# Patient Record
Sex: Female | Born: 1995 | Race: White | Hispanic: No | Marital: Married | Smoking: Never smoker
Health system: Southern US, Community
[De-identification: ages and names within clinical notes are randomized; demographics above are authoritative.]

## PROBLEM LIST (undated history)

## (undated) DIAGNOSIS — R109 Unspecified abdominal pain: Secondary | ICD-10-CM

## (undated) DIAGNOSIS — E7800 Pure hypercholesterolemia, unspecified: Secondary | ICD-10-CM

## (undated) DIAGNOSIS — E282 Polycystic ovarian syndrome: Secondary | ICD-10-CM

## (undated) DIAGNOSIS — E162 Hypoglycemia, unspecified: Secondary | ICD-10-CM

## (undated) DIAGNOSIS — O139 Gestational [pregnancy-induced] hypertension without significant proteinuria, unspecified trimester: Secondary | ICD-10-CM

## (undated) DIAGNOSIS — K589 Irritable bowel syndrome without diarrhea: Secondary | ICD-10-CM

## (undated) DIAGNOSIS — R197 Diarrhea, unspecified: Secondary | ICD-10-CM

## (undated) DIAGNOSIS — D682 Hereditary deficiency of other clotting factors: Secondary | ICD-10-CM

## (undated) HISTORY — DX: Gestational (pregnancy-induced) hypertension without significant proteinuria, unspecified trimester: O13.9

## (undated) HISTORY — PX: MANDIBLE SURGERY: SHX707

## (undated) HISTORY — PX: TYMPANOSTOMY TUBE PLACEMENT: SHX32

## (undated) HISTORY — DX: Unspecified abdominal pain: R10.9

## (undated) HISTORY — DX: Diarrhea, unspecified: R19.7

---
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
## 2016-05-17 ENCOUNTER — Ambulatory Visit (INDEPENDENT_AMBULATORY_CARE_PROVIDER_SITE_OTHER): Payer: Self-pay | Admitting: Sports Medicine

## 2016-10-24 ENCOUNTER — Observation Stay (HOSPITAL_COMMUNITY): Payer: 59

## 2016-10-24 ENCOUNTER — Observation Stay (HOSPITAL_COMMUNITY)
Admission: EM | Admit: 2016-10-24 | Discharge: 2016-10-25 | Disposition: A | Discharge status: Home / Self Care | Payer: 59 | Attending: Oncology | Admitting: Oncology

## 2016-10-24 ENCOUNTER — Encounter (HOSPITAL_COMMUNITY): Payer: Self-pay

## 2016-10-24 ENCOUNTER — Emergency Department (HOSPITAL_COMMUNITY): Payer: 59

## 2016-10-24 DIAGNOSIS — Z888 Allergy status to other drugs, medicaments and biological substances status: Secondary | ICD-10-CM | POA: Diagnosis not present

## 2016-10-24 DIAGNOSIS — F419 Anxiety disorder, unspecified: Secondary | ICD-10-CM | POA: Insufficient documentation

## 2016-10-24 DIAGNOSIS — E785 Hyperlipidemia, unspecified: Secondary | ICD-10-CM | POA: Insufficient documentation

## 2016-10-24 DIAGNOSIS — K580 Irritable bowel syndrome with diarrhea: Secondary | ICD-10-CM | POA: Insufficient documentation

## 2016-10-24 DIAGNOSIS — G459 Transient cerebral ischemic attack, unspecified: Secondary | ICD-10-CM | POA: Diagnosis not present

## 2016-10-24 DIAGNOSIS — Z832 Family history of diseases of the blood and blood-forming organs and certain disorders involving the immune mechanism: Secondary | ICD-10-CM

## 2016-10-24 DIAGNOSIS — Z8673 Personal history of transient ischemic attack (TIA), and cerebral infarction without residual deficits: Secondary | ICD-10-CM | POA: Diagnosis present

## 2016-10-24 DIAGNOSIS — Z7982 Long term (current) use of aspirin: Secondary | ICD-10-CM | POA: Insufficient documentation

## 2016-10-24 DIAGNOSIS — Q211 Atrial septal defect: Secondary | ICD-10-CM | POA: Insufficient documentation

## 2016-10-24 DIAGNOSIS — D682 Hereditary deficiency of other clotting factors: Secondary | ICD-10-CM | POA: Insufficient documentation

## 2016-10-24 DIAGNOSIS — E7800 Pure hypercholesterolemia, unspecified: Secondary | ICD-10-CM | POA: Diagnosis not present

## 2016-10-24 DIAGNOSIS — Z8249 Family history of ischemic heart disease and other diseases of the circulatory system: Secondary | ICD-10-CM | POA: Insufficient documentation

## 2016-10-24 HISTORY — DX: Pure hypercholesterolemia, unspecified: E78.00

## 2016-10-24 HISTORY — DX: Hereditary deficiency of other clotting factors: D68.2

## 2016-10-24 LAB — CBC WITH DIFFERENTIAL/PLATELET
BASOS PCT: 0 %
Basophils Absolute: 0 10*3/uL (ref 0.0–0.1)
Eosinophils Absolute: 0.1 10*3/uL (ref 0.0–0.7)
Eosinophils Relative: 2 %
HCT: 39.4 % (ref 36.0–46.0)
Hemoglobin: 13.2 g/dL (ref 12.0–15.0)
Lymphocytes Relative: 25 %
Lymphs Abs: 1.8 10*3/uL (ref 0.7–4.0)
MCH: 28.4 pg (ref 26.0–34.0)
MCHC: 33.5 g/dL (ref 30.0–36.0)
MCV: 84.9 fL (ref 78.0–100.0)
MONOS PCT: 9 %
Monocytes Absolute: 0.6 10*3/uL (ref 0.1–1.0)
Neutro Abs: 4.5 10*3/uL (ref 1.7–7.7)
Neutrophils Relative %: 64 %
Platelets: 235 10*3/uL (ref 150–400)
RBC: 4.64 MIL/uL (ref 3.87–5.11)
RDW: 13 % (ref 11.5–15.5)
WBC: 7 10*3/uL (ref 4.0–10.5)

## 2016-10-24 LAB — PROTIME-INR
INR: 1.09
Prothrombin Time: 14.2 seconds (ref 11.4–15.2)

## 2016-10-24 LAB — POC URINE PREG, ED: Preg Test, Ur: NEGATIVE

## 2016-10-24 LAB — BASIC METABOLIC PANEL
Anion gap: 8 (ref 5–15)
BUN: 8 mg/dL (ref 6–20)
CALCIUM: 9.2 mg/dL (ref 8.9–10.3)
CO2: 23 mmol/L (ref 22–32)
CREATININE: 0.56 mg/dL (ref 0.44–1.00)
Chloride: 107 mmol/L (ref 101–111)
GFR calc Af Amer: >60 mL/min (ref >60)
GFR calc non Af Amer: >60 mL/min (ref >60)
Glucose, Bld: 94 mg/dL (ref 65–99)
Potassium: 4.2 mmol/L (ref 3.5–5.1)
Sodium: 138 mmol/L (ref 135–145)

## 2016-10-24 LAB — APTT: aPTT: 31 seconds (ref 24–36)

## 2016-10-24 MED ORDER — ACETAMINOPHEN 160 MG/5ML PO SOLN
650.0000 mg | ORAL | Status: DC | PRN
Start: 2016-10-24 — End: 2016-10-25

## 2016-10-24 MED ORDER — ASPIRIN 300 MG RE SUPP: 300.0000 mg | Freq: Every day | RECTAL | Status: DC

## 2016-10-24 MED ORDER — ACETAMINOPHEN 650 MG RE SUPP: 650.0000 mg | RECTAL | Status: DC | PRN

## 2016-10-24 MED ORDER — STROKE: EARLY STAGES OF RECOVERY BOOK
Freq: Once | Status: AC
  Administered 2016-10-24: 16:00:00
  Filled 2016-10-24: qty 1

## 2016-10-24 MED ORDER — ACETAMINOPHEN 325 MG PO TABS: 650.0000 mg | ORAL_TABLET | ORAL | Status: DC | PRN

## 2016-10-24 MED ORDER — IOPAMIDOL (ISOVUE-370) INJECTION 76%
INTRAVENOUS | Status: AC
Start: 2016-10-24 — End: 2016-10-24
  Administered 2016-10-24: 50 mL
  Filled 2016-10-24: qty 50

## 2016-10-24 MED ORDER — ASPIRIN 325 MG PO TABS
325.0000 mg | ORAL_TABLET | Freq: Every day | ORAL | Status: DC
  Administered 2016-10-24 – 2016-10-25 (×2): 325 mg via ORAL
  Filled 2016-10-24 (×2): qty 1

## 2016-10-24 MED ORDER — ROSUVASTATIN CALCIUM 20 MG PO TABS
20.0000 mg | ORAL_TABLET | Freq: Every day | ORAL | Status: DC
Start: 2016-10-25 — End: 2016-10-25
  Administered 2016-10-25: 20 mg via ORAL
  Filled 2016-10-24 (×2): qty 1

## 2016-10-24 MED ORDER — ENOXAPARIN SODIUM 40 MG/0.4ML ~~LOC~~ SOLN
40.0000 mg | SUBCUTANEOUS | Status: DC
  Filled 2016-10-24: qty 0.4

## 2016-10-24 MED ORDER — LORAZEPAM 2 MG/ML IJ SOLN
0.5000 mg | Freq: Once | INTRAMUSCULAR | Status: AC
  Administered 2016-10-24: 0.5 mg via INTRAVENOUS
  Filled 2016-10-24: qty 1

## 2016-10-24 NOTE — Consult Note (Signed)
Neurology Consultation Reason for Consult: Right-sided numbness and weakness Referring Physician: Corlis Leak, C  CC: Right-sided weakness and numbness  History is obtained from: Patient  HPI: Bonnie Adkins is a 21 y.o. female who is in her normal state of health until 8 AM. At that time, she had sudden onset left sided paresthesia and numbness. This lasted for about ten minutes and then went away. She has had persistent mild weakness, but it was not bad enough to prevent her from walking. This is been steadily improving since that time, but it still is not completely gone.  She states that the numbness did not start on one place and spread it started throughout her right side all at once. She did not notice her face being very much involved.  She denies headaches, does not have a history of migraines. There is no family history of migraines. She never had any clouding of her sensorium or difficulty with speech.  She also has a family history of hypercholesterolemia and is currently taking Crestor.   LKW: 8am tpa given?: no, rapidly improving symptoms    ROS: A 14 point ROS was performed and is negative except as noted in the HPI.   Past Medical History:  Diagnosis Date  . Abdominal pain, recurrent   . Diarrhea   . Factor II deficiency (HCC)   . High cholesterol   . 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 She has never used smokeless tobacco. She reports that she does not drink alcohol or use drugs.   Exam: Current vital signs: BP 114/88   Pulse 75   Temp 99.1 F (37.3 C) (Oral)   Resp 20   Ht  (1.6 m)   Wt 52.2 kg (115 lb)   SpO2 100%   BMI 20.37 kg/m  Vital signs in last 24 hours: Temp:  [99.1 F (37.3 C)] 99.1 F (37.3 C) (04/01 1020) Pulse Rate:  [75-105] 75 (04/01 1130) Resp:  [18-20] 20 (04/01 1130) BP: (114-139)/(88-89) 114/88 (04/01 1130) SpO2:  [100 %] 100 % (04/01 1130) Weight:  [52.2  kg (115 lb)] 52.2 kg (115 lb) (04/01 1021)   Physical Exam  Constitutional: Appears well-developed and well-nourished.  Psych: Affect appropriate to situation Eyes: No scleral injection HENT: No OP obstrucion Head: Normocephalic.  Cardiovascular: Normal rate and regular rhythm.  Respiratory: Effort normal and breath sounds normal to anterior ascultation GI: Soft.  No distension. There is no tenderness.  Skin: WDI  Neuro: Mental Status: Patient is awake, alert, oriented to person, place, month, year, and situation. Patient is able to give a clear and coherent history. No signs of aphasia or neglect Cranial Nerves: II: Visual Fields are full. Pupils are equal, round, and reactive to light.   III,IV, VI: EOMI without ptosis or diploplia.  V: Facial sensation is symmetric to temperature VII: Facial movement is symmetric.  VIII: hearing is intact to voice X: Uvula elevates symmetrically XI: Shoulder shrug is symmetric. XII: tongue is midline without atrophy or fasciculations.  Motor: Tone is normal. Bulk is normal. 5/5 strength was present in all four extremities.  Sensory: Sensation is symmetric to light touch and temperature in the arms and legs. Deep Tendon Reflexes: 2+ and symmetric in the biceps and patellae.  Cerebellar: FNF  intact bilaterally    I have reviewed labs in epic and the results pertinent to this consultation are: BMP-unremarkable  I have reviewed the images  obtained: CT head-unremarkable  Impression: 21 year old female with a history of hypercoagulability who presents with transient right arm and leg weakness and numbness. She is not candidate for IV TPA given that she has almost completely resolved symptoms. Given her risk factor, I do think that further evaluation is needed. If a right-to-left shunt is identified, then her hypercoagulability is a significant risk factor for stroke.  Recommendations: 1. HgbA1c, fasting lipid panel 2. MRI of the brain  without contrast 3. Frequent neuro checks 4. Echocardiogram 5. CT angiogram of the head and neck 6. Prophylactic therapy-Antiplatelet med: Aspirin - dose  PO or  PR 7. Risk factor modification 8. Telemetry monitoring 9. PT consult, OT consult, Speech consult 10. please page stroke NP  Or  PA  Or MD  from 8am -4 pm starting 4/2 as this patient will be followed by the stroke team at this point.   You can look them up on [email or web address]      Ritta Slot, MD [facility] [phone]  If 7pm- 7am, please page neurology on call as listed in AMION.

## 2016-10-24 NOTE — ED Triage Notes (Addendum)
Patient had 10 minute episode at church today where her right side felt numb. States that when the symptoms started she thought leg asleep and got up and walked around with no trouble ambulating. On arrival complains of general weakness and no further numbness. Alert and oriented, no neuro deficits. 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 Has never had a clotting problem related to same, takes no meds for same also has high cholesterol and takes daily meds to control.

## 2016-10-24 NOTE — ED Notes (Signed)
Called CT advised patient next to be scanned.

## 2016-10-24 NOTE — H&P (Signed)
Date: 10/24/2016               Patient Name:  Bonnie Adkins MRN: [id number]  DOB: 1995/08/13 Age / Sex: 21 y.o., female   PCP: Eartha Inch, MD         Medical Service: Internal Medicine Teaching Service         Attending Physician: Dr. Levert Feinstein, MD    First Contact: Dr. Mikey Bussing  Pager: [phone]  Second Contact: Dr. Allena Katz Pager: [phone]       After Hours (After 5p/  First Contact Pager: [phone]  weekends / holidays): Second Contact Pager: [phone]   Chief Complaint: Right arm and leg numbness  History of Present Illness: Bonnie Adkins Is a 21 year old female with history of hypercholesterolemia and factor II deficiency that presents to the emergency department with right arm and leg numbness. She reports being at church when she started feeling her right arm and leg going numb. She got up to walk around but this did not resolve her symptoms.  She reports associated symptoms of weakness but was able to ambulate. She reports the episode of numbness lasted for about 10 minutes and resolved. The associated weakness continued but improved when she arrived to the emergency room. It was completely resolved when we assessed the patient. She denied slurred speech, changes in vision, heart palpitations, shortness of breath or chest pain.  Patient takes crestor for her hyperlipidemia and is on progestin only oral contraceptive.   Meds:  Current Meds  Medication Sig  . acetaminophen (TYLENOL) 500 MG tablet Take 500 mg by mouth every 6 (six) hours as needed for mild pain. For pain  . Ascorbic Acid (VITAMIN C) 100 MG tablet Take 100 mg by mouth daily.  Marland Kitchen bismuth subsalicylate (PEPTO BISMOL) 262 MG chewable tablet Chew 524 mg by mouth as needed for indigestion.  . cetirizine (ZYRTEC) 10 MG tablet Take 10 mg by mouth daily.  . Cholecalciferol (VITAMIN D3) 2000 units TABS Take 2,000 Units by mouth once a week. Mondays  . Multiple Vitamin (MULTIVITAMIN) tablet Take 1 tablet by mouth daily.    . norethindrone (DEBLITANE) 0.35 MG tablet Take 1 tablet by mouth daily.  . Probiotic Product (PROBIOTIC PO) Take 1 tablet by mouth daily.  . rosuvastatin (CRESTOR) 20 MG tablet Take 20 mg by mouth daily.     Allergies: Allergies as of 10/24/2016 - Review Complete 10/24/2016  Allergen Reaction Noted  . Pseudoephedrine hcl Palpitations 12/03/2013   Past Medical History:  Diagnosis Date  . Abdominal pain, recurrent   . Diarrhea   . Factor II deficiency (HCC)   . High cholesterol   . Hypoglycemia     Family History: Mother: Factor II deficiency  Social History: Tobacco:  Denies Alcohol use: Denies Illicit drug use: Denies  Review of Systems: A complete ROS was negative except as per HPI.   Physical Exam: Blood pressure 108/67, pulse 90, temperature 99.1 F (37.3 C), temperature source Oral, resp. rate 14, height  (1.6 m), weight 115 lb (52.2 kg), SpO2 99 %. Physical Exam  Constitutional: She is oriented to person, place, and time and well-developed, well-nourished, and in no distress.  Cardiovascular: Normal rate, regular rhythm and normal heart sounds.  Exam reveals no gallop and no friction rub.   No murmur heard. Pulmonary/Chest: Effort normal and breath sounds normal. No respiratory distress. She has no wheezes. She has no rales.  Musculoskeletal: She exhibits no edema.  Neurological: She is  alert and oriented to person, place, and time.  CN II-XII intact Sensation intact bilaterally in upper and lower extremities Motor strength 5/5 in upper and lower extremities bilaterally  Skin: Skin is warm and dry.    EKG: Pending  Assessment & Plan by Problem: Active Problems:   TIA (transient ischemic attack) Patient has a past medical history of clotting disorder and hypercholesterolemia. She had an episode of transient right arm and leg numbness with weakness that has completely resolved. CT head showed no acute intracranial pathology.  Neurology was consulted.  Patient will be admitted for stroke workup. 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  In [place] 2016 patient had a total cholesterol in the 300s and LDL of 235. In November 2017 cholesterol had improved to 1:30 and LDL to 67. 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   -CBC - DVT prophylaxis with sub Q lovenox   Code status:  Full code Diet:  Heart Healthy DVT prophylaxis: sub q lovenox    Dispo: Admit patient to Observation with expected length of stay less than 2 midnights.  Signed: Camelia Phenes, DO 10/24/2016, 2:03 PM  Pager: [phone]

## 2016-10-24 NOTE — ED Notes (Signed)
MD at bedside,

## 2016-10-24 NOTE — ED Provider Notes (Signed)
[facility]-EMERGENCY DEPT Provider Note   CSN: [id number] Arrival date & time: 10/24/16  1006     History   Chief Complaint Chief Complaint  Patient presents with  . right side tingling    HPI Bonnie Adkins is a 21 y.o. female.  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  After this gradually resolved she developed weakness on the right, more significant in the arm than leg.   She is on oral birth control (mini pill).   Denies facial changes, headache, altered speech or gait.  Family notes she has been acting normally.  Had only been sitting 10 minutes when the symptoms began.  No recent falls.    PCP Dr Antony Haste.    Past Medical History:  Diagnosis Date  . Abdominal pain, recurrent   . Diarrhea   . Factor II deficiency (HCC)   . High cholesterol   . Hypoglycemia     Patient Active Problem List   Diagnosis Date Noted  . TIA (transient ischemic attack) 10/24/2016  . Diarrhea   . Abdominal pain, recurrent     Past Surgical History:  Procedure Laterality Date  . MANDIBLE SURGERY    . TYMPANOSTOMY TUBE PLACEMENT      OB History    No data available       Home Medications    Prior to Admission medications   Medication Sig Start Date End Date Taking? Authorizing Provider  acetaminophen (TYLENOL) 500 MG tablet Take 500 mg by mouth every 6 (six) hours as needed for mild pain. For pain   Yes Historical Provider, MD  Ascorbic Acid (VITAMIN C) 100 MG tablet Take 100 mg by mouth daily.   Yes Historical Provider, MD  bismuth subsalicylate (PEPTO BISMOL) 262 MG chewable tablet Chew 524 mg by mouth as needed for indigestion.   Yes Historical Provider, MD  cetirizine (ZYRTEC) 10 MG tablet Take 10 mg by mouth daily.   Yes Historical Provider, MD  Cholecalciferol (VITAMIN D3) 2000 units TABS Take 2,000 Units by  mouth once a week. Mondays   Yes Historical Provider, MD  Multiple Vitamin (MULTIVITAMIN) tablet Take 1 tablet by mouth daily.   Yes Historical Provider, MD  norethindrone (DEBLITANE) 0.35 MG tablet Take 1 tablet by mouth daily. 04/07/16  Yes Historical Provider, MD  Probiotic Product (PROBIOTIC PO) Take 1 tablet by mouth daily.   Yes Historical Provider, MD  rosuvastatin (CRESTOR) 20 MG tablet Take 20 mg by mouth daily. 10/10/16  Yes Historical Provider, MD    Family History No family history on file.  Social History Social History  Substance Use Topics  . Smoking status: Never Smoker  . Smokeless tobacco: Never Used  . Alcohol use No     Allergies   Pseudoephedrine hcl   Review of Systems Review of Systems  All other systems reviewed and are negative.    Physical Exam Updated Vital Signs BP 121/73 (BP Location: Left Arm)   Pulse (!) 105   Temp 98.6 F (37 C) (Oral)   Resp 16   Ht  (1.6 m)   Wt 52.2 kg   SpO2 100%   BMI 20.37 kg/m   Physical Exam  Constitutional: She appears well-developed and well-nourished. No distress.  HENT:  Head: Normocephalic and atraumatic.  Neck: Neck supple.  Cardiovascular: Normal rate and regular rhythm.  No murmur heard. Pulmonary/Chest: Effort normal and breath sounds normal. No respiratory distress. She has no wheezes. She has no rales.  Neurological: She is alert.  CN II-XII intact, EOMs intact, no pronator drift, grip strengths equal bilaterally; strength 5/5 in all extremities, sensation intact in all extremities; finger to nose, heel to shin, rapid alternating movements normal.     Skin: She is not diaphoretic.  Nursing note and vitals reviewed.    ED Treatments / Results  Labs (all labs ordered are listed, but only abnormal results are displayed) Labs Reviewed  BASIC METABOLIC PANEL  CBC WITH DIFFERENTIAL/PLATELET  PROTIME-INR  APTT  HIV ANTIBODY (ROUTINE TESTING)  POC URINE PREG, ED    EKG  EKG  Interpretation  Date/Time:  Sunday October 24 2016 14:11:00 EDT Ventricular Rate:  90 PR Interval:    QRS Duration: 89 QT Interval:  348 QTC Calculation: 426 R Axis:   83 Text Interpretation:  Sinus rhythm RSR' in V1 or V2, right VCD or RVH No significant change since last tracing Confirmed by Kandis Mannan ([id number]) on 10/24/2016 2:13:58 PM       Radiology Ct Head Wo Contrast  Result Date: 10/24/2016 CLINICAL DATA:  Right arm tingling, subjective weakness EXAM: CT HEAD WITHOUT CONTRAST TECHNIQUE: Contiguous axial images were obtained from the base of the skull through the vertex without intravenous contrast. COMPARISON:  None. FINDINGS: Brain: No evidence of acute infarction, hemorrhage, hydrocephalus, extra-axial collection or mass lesion/mass effect. Vascular: No hyperdense vessel or unexpected calcification. Skull: No osseous abnormality. Sinuses/Orbits: Visualized paranasal sinuses are clear. Visualized mastoid sinuses are clear. Visualized orbits demonstrate no focal abnormality. Other: None IMPRESSION: No acute intracranial pathology. Electronically Signed   By: Elige Ko   On: 10/24/2016 12:44    Procedures Procedures (including critical care time)  Medications Ordered in ED Medications   stroke: mapping our early stages of recovery book (not administered)  acetaminophen (TYLENOL) tablet 650 mg (not administered)    Or  acetaminophen (TYLENOL) solution 650 mg (not administered)    Or  acetaminophen (TYLENOL) suppository 650 mg (not administered)  enoxaparin (LOVENOX) injection 40 mg (not administered)  aspirin suppository 300 mg (not administered)    Or  aspirin tablet 325 mg (not administered)  rosuvastatin (CRESTOR) tablet 20 mg (not administered)     Initial Impression / Assessment and Plan / ED Course  I have reviewed the triage vital signs and the nursing notes.  Pertinent labs & imaging results that were available during my care of the patient were reviewed by  me and considered in my medical decision making (see chart for details).  Clinical Course as of Oct 24 1517  Sun Oct 24, 2016  1132 Dr Amada Jupiter, neurology, to see patient.   [EW]  1144 Pt seen by Dr Amada Jupiter.  Plan for admission for TIA workup.   [EW]  1257 Admitted to [facility] Internal Medicine Teaching Service   [EW]    Clinical Course User Index [EW] Trixie Dredge, PA-C    Afebrile nontoxic patient with factor II mutation and hypercholesterolemia p/w right arm and leg numbness/tingling followed by weakness that is slowly resolving.  Neurologic exam is normal.  Labs, CT head negative.  Also seen by neurologist Dr Amada Jupiter who recommends admission for TIA workup.  Admitted to Internal Medicine Teaching Service.    Final Clinical Impressions(s) / ED Diagnoses   Final diagnoses:  Transient cerebral ischemia, unspecified type  TIA (transient ischemic attack)  TIA (transient ischemic attack)  TIA (  transient ischemic attack)    New Prescriptions Current Discharge Medication List       Trixie Dredge, [place] 10/24/16 1521    Courteney Randall An, MD 10/25/16 0710

## 2016-10-24 NOTE — Progress Notes (Signed)
Patient giving herself her home supply dose of  Crestor and a probiotic, per her wishes

## 2016-10-25 ENCOUNTER — Observation Stay (HOSPITAL_BASED_OUTPATIENT_CLINIC_OR_DEPARTMENT_OTHER): Payer: 59

## 2016-10-25 ENCOUNTER — Observation Stay (HOSPITAL_COMMUNITY): Payer: 59

## 2016-10-25 DIAGNOSIS — G458 Other transient cerebral ischemic attacks and related syndromes: Secondary | ICD-10-CM | POA: Diagnosis not present

## 2016-10-25 DIAGNOSIS — G459 Transient cerebral ischemic attack, unspecified: Secondary | ICD-10-CM

## 2016-10-25 DIAGNOSIS — D682 Hereditary deficiency of other clotting factors: Secondary | ICD-10-CM | POA: Diagnosis not present

## 2016-10-25 DIAGNOSIS — E785 Hyperlipidemia, unspecified: Secondary | ICD-10-CM | POA: Diagnosis not present

## 2016-10-25 LAB — LIPID PANEL
Cholesterol: 110 mg/dL (ref 0–200)
HDL: 53 mg/dL (ref >40)
LDL CALC: 50 mg/dL (ref 0–99)
TRIGLYCERIDES: 35 mg/dL (ref <150)
Total CHOL/HDL Ratio: 2.1 RATIO
VLDL: 7 mg/dL (ref 0–40)

## 2016-10-25 LAB — ECHOCARDIOGRAM COMPLETE
Height: 63 in
Weight: 1840 oz

## 2016-10-25 LAB — RAPID URINE DRUG SCREEN, HOSP PERFORMED
AMPHETAMINES: NOT DETECTED
Barbiturates: NOT DETECTED
Benzodiazepines: NOT DETECTED
Cocaine: NOT DETECTED
Opiates: NOT DETECTED
Tetrahydrocannabinol: NOT DETECTED

## 2016-10-25 LAB — HIV ANTIBODY (ROUTINE TESTING W REFLEX): HIV Screen 4th Generation wRfx: NONREACTIVE

## 2016-10-25 LAB — GLUCOSE, CAPILLARY: Glucose-Capillary: 79 mg/dL (ref 65–99)

## 2016-10-25 MED ORDER — ASPIRIN EC 81 MG PO TBEC: 81.0000 mg | DELAYED_RELEASE_TABLET | Freq: Every day | ORAL | Status: DC

## 2016-10-25 MED ORDER — ASPIRIN 81 MG PO TBEC: 81.0000 mg | DELAYED_RELEASE_TABLET | Freq: Every day | ORAL | 0 refills | Status: DC

## 2016-10-25 NOTE — Evaluation (Signed)
Physical Therapy Evaluation and D/C Patient Details Name: Bonnie Adkins MRN: [id number] DOB: 03/09/1996 Today's Date: 10/25/2016   History of Present Illness  Pt is a 21 y.o. female admitted for 10 minute episode of  R sides numbness with associated weakness. Stroke workup was completed and negative.  PMH includes hyperlipidemia, recurrent abdominal pain, diarrhea. factor II deficiency, and hypoglycemia.  Clinical Impression  PT evaluation showed no functional limitations and patient is safe to return home with family. She is independent with transfers and supervision with ambulation.  PT will sign off but please contact if further assistance is needed.    Follow Up Recommendations No PT follow up    Equipment Recommendations  None recommended by PT    Recommendations for Other Services       Precautions / Restrictions Precautions Precautions: Fall Restrictions Weight Bearing Restrictions: No      Mobility  Bed Mobility Overal bed mobility: Independent             General bed mobility comments: Pt required no assistance or cueing to get to EOB.  Transfers Overall transfer level: Independent Equipment used: None             General transfer comment: Pt stood from EOB before being instructed.  Ambulation/Gait Ambulation/Gait assistance: Supervision Ambulation Distance (Feet): 300 Feet Assistive device: None Gait Pattern/deviations: Step-through pattern;Narrow base of support;Antalgic Gait velocity: normal Gait velocity interpretation: at or above normal speed for age/gender General Gait Details: Pt kept R arm by side with a slight fist in hand.  Pt stated it was because of discomfort with IV site. Gait appeared slightly antalgic.  Stairs            Wheelchair Mobility    Modified Rankin (Stroke Patients Only)       Balance Overall balance assessment: Independent                                           Pertinent Vitals/Pain  Pain Assessment: Faces Faces Pain Scale: Hurts a little bit Pain Location: IV site on R forearm and L hand. Pain Descriptors / Indicators: Discomfort Pain Intervention(s): Monitored during session    Home Living Family/patient expects to be discharged to:: Private residence Living Arrangements: Parent Available Help at Discharge: Family Type of Home: House Home Access: Stairs to enter   Secretary/administrator of Steps: 3 Home Layout: Two level Home Equipment: None Additional Comments: Pt lives with her parents.  Her bedroom is on the second floor.    Prior Function Level of Independence: Independent         Comments: Pt wants to return home to continue planning for her wedding in August.     Hand Dominance   Dominant Hand: Right    Extremity/Trunk Assessment   Upper Extremity Assessment Upper Extremity Assessment: Overall WFL for tasks assessed    Lower Extremity Assessment Lower Extremity Assessment: Overall WFL for tasks assessed    Cervical / Trunk Assessment Cervical / Trunk Assessment: Normal  Communication   Communication: No difficulties  Cognition Arousal/Alertness: Awake/alert Behavior During Therapy: WFL for tasks assessed/performed Overall Cognitive Status: Within Functional Limits for tasks assessed  General Comments General comments (skin integrity, edema, etc.): Pt had breakfast in her room but stated she did not have an appetite to eat.  Pt also noted feeling tired and that she did not sleep the night before.  Pt also stated she has a wedding dress fitting coming up.    Exercises     Assessment/Plan    PT Assessment Patent does not need any further PT services  PT Problem List         PT Treatment Interventions      PT Goals (Current goals can be found in the Care Plan section)  Acute Rehab PT Goals Patient Stated Goal: To get back to wedding planning PT Goal Formulation: With  patient Time For Goal Achievement: 11/01/16 Potential to Achieve Goals: Good    Frequency     Barriers to discharge        Co-evaluation               End of Session Equipment Utilized During Treatment: Gait belt Activity Tolerance: Patient tolerated treatment well Patient left: in chair;with call bell/phone within reach;with family/visitor present Nurse Communication: Mobility status PT Visit Diagnosis: Other symptoms and signs involving the nervous system (R29.898) (R sided numbness)    Time: 1610-9604 PT Time Calculation (min) (ACUTE ONLY): 10 min   Charges:   PT Evaluation $PT Eval Low Complexity: 1 Procedure     PT G Codes:   PT G-Codes **NOT FOR INPATIENT CLASS** Functional Assessment Tool Used: Clinical judgement Functional Limitation: Mobility: Walking and moving around Mobility: Walking and Moving Around Current Status (V4098): At least 1 percent but less than 20 percent impaired, limited or restricted Mobility: Walking and Moving Around Goal Status [phone]): At least 1 percent but less than 20 percent impaired, limited or restricted Mobility: Walking and Moving Around Discharge Status [phone]): At least 1 percent but less than 20 percent impaired, limited or restricted    Willaim Rayas SPT  Willaim Rayas 10/25/2016, 11:55 AM

## 2016-10-25 NOTE — Progress Notes (Signed)
Pt reports increase nausea and feelings of Hypoglycemia, nurse checked pt's Blood sugar, Pt is 79, Nurse encouraged pt to eat, drink and try to rest, pt verbalize understanding. Nurse will reasses pt for increase or changes in symptoms

## 2016-10-25 NOTE — Care Management Note (Signed)
Case Management Note  Patient Details  Name: Samaria Anes MRN: [id number] Date of Birth: 01-08-1996  Subjective/Objective:        Patient presented with right-sided numbness. Lives at home with parent. CM will follow for discharge needs pending patient's progress and physician orders.             Action/Plan:   Expected Discharge Date:                  Expected Discharge Plan:     In-House Referral:     Discharge planning Services     Post Acute Care Choice:    Choice offered to:     DME Arranged:    DME Agency:     HH Arranged:    HH Agency:     Status of Service:     If discussed at [employer] of Stay Meetings, dates discussed:    Additional Comments:  Anda Kraft, RN 10/25/2016, 10:38 AM

## 2016-10-25 NOTE — Progress Notes (Signed)
VASCULAR LAB PRELIMINARY  PRELIMINARY  PRELIMINARY  PRELIMINARY  Transcranial Doppler with Bubbles completed.    Preliminary report: Minimal HITS noted at rest and with valsalva. Small PFO  Viral Schramm, RVS 10/25/2016, 3:12 PM

## 2016-10-25 NOTE — Progress Notes (Signed)
  Echocardiogram 2D Echocardiogram has been performed.  Tye Savoy 10/25/2016, 1:37 PM

## 2016-10-25 NOTE — Progress Notes (Signed)
OT Cancellation Note  Patient Details Name: Bonnie Adkins MRN: [id number] DOB: [birth year]   Cancelled Treatment:    Reason Eval/Treat Not Completed: OT screened, no needs identified, will sign off. All of pt's symptoms have resolved and she is able to perform ADL in hospital setting independently. No further OT needs and pt/family report no further questions or concerns. OT will sign off. Please re-order if needs change.  Doristine Section, MS OTR/L  Pager: [phone]   Doristine Section 10/25/2016, 9:13 AM

## 2016-10-25 NOTE — Progress Notes (Addendum)
STROKE TEAM PROGRESS NOTE   SUBJECTIVE (INTERVAL HISTORY) Her mother & fiance are at the bedside.  Patient recounted HPI with Dr. Pearlean Brownie. She feels back to baseline now. No previous similar sx. No migraines, sz, head injury. Does take "mini pill" (has factor II gene mutation) so only takes progestin. Mother has hx of DVT, factor 2. Recent plane trip to [place]. No heavy physical activity recently - jogged for a few minutes last week. Has high cholesterol (familial). No miscarriages. 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 Sx are worrisome for stroke/TIA.   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 No new unusual stress.  Patient wants to avoid doing a TEE if possible. She denies any pain in the legs, prolonged travel in a car or airplane recently and significant physical exertion prior to the onset of symptoms. She has no history of deep vein thrombosis, pulmonary embolism or recurrent miscarriages. She has a family history of prothrombin 2 gene mutation   OBJECTIVE Temp:  [97.9 F (36.6 C)-99 F (37.2 C)] 98.3 F (36.8 C) (04/02 1010) Pulse Rate:  [74-111] 84 (04/02 1010) Cardiac Rhythm: Sinus tachycardia (04/02 0834) Resp:  [13-23] 19 (04/02 1010) BP: (97-121)/(59-88) 99/63 (04/02 1010) SpO2:  [97 %-100 %] 99 % (04/02 1010)  CBC:  Recent Labs Lab 10/24/16 1058  WBC 7.0  NEUTROABS 4.5  HGB 13.2  HCT 39.4  MCV 84.9  PLT 235    Basic Metabolic Panel:  Recent Labs Lab 10/24/16 1058  NA 138  K 4.2  CL 107  CO2 23  GLUCOSE 94  BUN 8  CREATININE 0.56  CALCIUM 9.2   HgbA1c: No results found for: HGBA1C Urine Drug Screen: No results found for: LABOPIA, COCAINSCRNUR, LABBENZ, AMPHETMU, THCU, LABBARB    PHYSICAL EXAM Pleasant frail young Caucasian lady not in distress. . Afebrile. Head is nontraumatic. Neck is supple without bruit.    Cardiac exam no murmur or gallop. Lungs are clear to auscultation. Distal pulses are well felt. Neurological Exam ;   Awake  Alert oriented x 3. Normal speech and language.eye movements full without nystagmus.fundi were not visualized. Vision acuity and fields appear normal. Hearing is normal. Palatal movements are normal. Face symmetric. Tongue midline. Normal strength, tone, reflexes and coordination. Normal sensation. Gait deferred.  ASSESSMENT/PLAN Ms. Bonnie Adkins is a 21 y.o. female with history of IBS, HLD and factor II deficiency presenting with R sided numbness and weakness. She did not receive IV t-PA due to rapidly improving symptoms.   L brain TIA  CT head no acute abnormality  CTA head and neck normal  MRI  No acute infarct  2D Echo  pending   Lupus AC pending   Homocysteine pending   Cardiolipin abx pending   Beta2 glycoprotein pending   HIV pending   Pregnancy test neg  TCD bubble study this afternoon by Dr. Pearlean Brownie  LDL 50  HgbA1c pending  Lovenox 40 mg sq daily for VTE prophylaxis  Diet Heart Room service appropriate? Yes; Fluid consistency: Thin  No antithrombotic prior to admission, now on aspirin 325 mg daily. Ok to discharge on aspirin 81 mg daily  Therapy recommendations:  No therapy needs  Disposition:  Return home (independent, lives at home w/ parents, in [employer] school, works)  Hyperlipidemia  Home meds:  crestor 20, resumed in hospital  LDL at goal  Continue statin at discharge  Other Stroke Risk Factors  UDS ordered  Other Active Problems  Factor II  mutation  IBS  Hospital day # 0  [facility] See Amion for Pager information 10/25/2016 1:34 PM  I have personally examined this patient, reviewed notes, independently viewed imaging studies, participated in medical decision making and plan of care.ROS completed by me personally and pertinent positives fully documented  I have made any additions or clarifications directly to the above note. Agree with note above. . She has presented with sudden onset of right upper and  lower extremity paresthesias not involving the face and not accompanied by headache. Left brain Subcortical TIA a the possibility but she lacks any significant vascular risk factors except family history of hypercoagulability and familial hyperlipidemia which is extremely well controlled. Recommend check transcranial Doppler bubble study for right-to-left shunt and lower extremity venous Dopplers for DVT. Start aspirin 81 mg daily. Patient is anxious and does not want to get a TEE. Discussed with patient, fianc and mother at the bedside and answered questions Greater than 50% time during this 35 minute visit was spent in counseling and coordination of care note constant numbness episode, discussion of plan for evaluation, treatment and answering questions.  Delia Heady, MD Medical Director [facility] Pager: [phone] 10/25/2016 2:13 PM  To contact Stroke Continuity provider, please refer to [email or web address]. After hours, contact General Neurology

## 2016-10-25 NOTE — Progress Notes (Signed)
Pt provided education by RN to return Home med's or give it to pharmacy to dispense. Pt verbalize understanding and will have Mom take medication home. Nurse notified Pharmacy. Pt will continue with Hospital dispensed medication.

## 2016-10-25 NOTE — Progress Notes (Signed)
*  PRELIMINARY RESULTS* Vascular Ultrasound Bilateral lower extremity venous duplex has been completed.  Preliminary findings: No evidence of deep vein thrombosis or baker's cysts bilaterally.   Chauncey Fischer 10/25/2016, 4:05 PM

## 2016-10-25 NOTE — Progress Notes (Signed)
Paged by RN regarding nausea and vomiting.  Evaluated at bedside with parents   [name] reports she has been feeling nausea all day, which she thinks is due to anxiety over her hospitalization and diagnostic procedures.  This afternoon she drank ginger ale and thinks she drank it too quickly, causing her to vomit.  According to the patient and her parents, it is not unusual for her to vomit due to stress and anxiety.  Now she is worried that because of vomiting she won't be able to be discharged this afternoon, and she is eager to go home.  She denies headache, diplopia, weakness, numbness, fevers, abdominal pain, or other symptoms.  Vitals:   10/25/16 1010 10/25/16 1819  BP: 99/63 107/65  Pulse: 84 (!) 103  Resp: 19 18  Temp: 98.3 F (36.8 C) 98.1 F (36.7 C)    Physical Exam  Constitutional:  Anxious young woman intermittently vomiting small amount of clear, yellow tinged liquid into trash can  Abdominal: Soft. She exhibits no distension. There is no tenderness.  Neurological:  Alert and oriented Face symmetric Phonation normal Hearing intact to conversational volume PERRL EOMI Sensation intact and symmetric in V1,V2,V3 Face symmetric at rest, eyebrows and smile symmetric Tongue midline Shoulder shrug 5/5 symmetric Sensation grossly intact to light touch throughout Strength 5/5 symmetric in all extremities FtN intact bilaterally Casual gait intact   Psychiatric:  Anxious Thought content normal Thought process linear   A/P Anxiety-induced nausea and vomiting.  Normal neurologic exam, no fevers or other concerning symptoms.  No headaches, diplopia, ophthalmoplegia, mydriasis to suggest increased ICP.  Per patient and parent's report she is at her baseline and her nausea is not remarkable. -ok to proceed with discharge -call PCP tomorrow for appointment this week -Additional return precautions given for persistent N/V, abdominal pain, fevers, or other symptoms

## 2016-10-25 NOTE — Progress Notes (Signed)
   Subjective: Patient was evaluated this morning on rounds. She has not had recurring episodes of numbness or weakness in her upper and lower extremities since admission.  Objective:  Vital signs in last 24 hours: Vitals:   10/25/16 0100 10/25/16 0230 10/25/16 0641 10/25/16 1010  BP: 97/68 102/61 (!) 103/59 99/63  Pulse: 74 94 77 84  Resp: Temp: 98.4 F (36.9 C) 98.1 F (36.7 C) 97.9 F (36.6 C) 98.3 F (36.8 C)  TempSrc: Oral Oral Oral Oral  SpO2: 97% 98% 98% 99%  Weight:      Height:       Physical Exam  Constitutional: She is well-developed, well-nourished, and in no distress.  Cardiovascular: Normal rate, regular rhythm and normal heart sounds.  Exam reveals no gallop and no friction rub.   No murmur heard. Pulmonary/Chest: Effort normal and breath sounds normal. No respiratory distress. She has no wheezes. She has no rales.  Musculoskeletal: She exhibits no edema or tenderness.  Neurological: She is alert. No cranial nerve deficit.  5/5 motor strength intact in upper and lower extremities bilaterally Sensation intact in upper and lower extremities bilaterally  Skin: Skin is warm and dry.     Assessment/Plan:  Active Problems:   TIA (transient ischemic attack)  Transient Ischemic Attack Patient denies any episodes of numbness or weakness in her extremities overnight.  CTA of head and neck and MRI of the brain impression showed no definite acute abnormalities.  Patient is having an echocardiogram today to assess for ASD or PFO. If negative will need to consider getting a TEE.  Her fasting lipid panel showed total cholesterol of 110 and LDL of 50.  Per neurology patient was started on aspirin 325 mg.  Per neurology UDS was ordered. Patient received Ativan yesterday due to feeling anxious during her IV procedures.  Ultrasound transcranial Doppler was also ordered per neurology Will also screen for antiphospholipid antibody syndrome and for hyper-homocystinemia.   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     -Hemoglobin A1c is pending -Appreciate neurology's recommendations -Checking lupus anticoagulant, Cardiolite and antibodies, beta-2 glycoprotein, and homocystine -Echo pending -[place] transcranial doppler per neurology  Hyperlipidemia Likely hereditary given age and BMI.  LDL is 50, total cholesterol 110. 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   -CBC - DVT prophylaxis with sub Q lovenox   Code status:  Full code Diet:  Heart Healthy DVT prophylaxis: sub q lovenox   Dispo: Anticipated discharge pending imaging results  Camelia Phenes, DO 10/25/2016, 1:12 PM Pager: [phone]

## 2016-10-25 NOTE — Progress Notes (Signed)
Pt D/C home, No new concerns, D/C instructions done with teach back, Pt transported home by family

## 2016-10-26 LAB — LUPUS ANTICOAGULANT
DPT CONFIRM RATIO: 1.04 ratio (ref 0.00–1.40)
DPT: 42.9 s (ref 0.0–55.0)
DRVVT: 34.1 s (ref 0.0–47.0)
PTT Lupus Anticoagulant: 33.7 s (ref 0.0–51.9)
Thrombin Time: 18.5 s (ref 0.0–23.0)

## 2016-10-26 LAB — BETA-2-GLYCOPROTEIN I ABS, IGG/M/A: Beta-2-Glycoprotein I IgA: <9 GPI IgA units (ref 0–25)

## 2016-10-26 LAB — HEMOGLOBIN A1C
Hgb A1c MFr Bld: 5 % (ref 4.8–5.6)
Mean Plasma Glucose: 97 mg/dL

## 2016-10-26 LAB — HOMOCYSTEINE: Homocysteine: 5.4 umol/L (ref 0.0–15.0)

## 2016-10-27 ENCOUNTER — Telehealth: Payer: Self-pay | Admitting: Neurology

## 2016-10-27 LAB — CARDIOLIPIN ANTIBODIES, IGG, IGM, IGA: Anticardiolipin IgM: 14 MPL U/mL — ABNORMAL HIGH (ref 0–12)

## 2016-10-27 NOTE — Telephone Encounter (Signed)
Patient called office in reference to being seen in hospital with Dr. Pearlean Brownie.  Patient is needing a note stating she is able to return back to school with no restrictions for Monday.  Please call

## 2016-10-27 NOTE — Telephone Encounter (Signed)
Called patient. Advised Dr Pearlean Brownie out of the office until Monday. He is working in the hospital this week. He will be back in the office next week.  Pt stated this cannot wait until next week. 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  Advised since she has never seen anyone in our office, clearance has to come from Dr Pearlean Brownie. I will try and see if we can receive an answer from Dr Pearlean Brownie. I will call her back once I know more. She verbalized understanding.

## 2016-10-27 NOTE — Discharge Summary (Signed)
Name: Bonnie Adkins MRN: [id number] DOB: 1996-06-23 21 y.o. PCP: Bonnie Inch, MD  Date of Admission: 10/24/2016 10:26 AM Date of Discharge: 10/25/2016 Attending Physician: Dr. Cyndie Chime  Discharge Diagnosis: 1.Transient ischemic attack  Active Problems:   TIA (transient ischemic attack)   Discharge Medications: Allergies as of 10/25/2016      Reactions   Pseudoephedrine Hcl Palpitations      Medication List    TAKE these medications   acetaminophen 500 MG tablet Commonly known as:  TYLENOL Take 500 mg by mouth every 6 (six) hours as needed for mild pain. For pain   aspirin 81 MG EC tablet Take 1 tablet (81 mg total) by mouth daily.   bismuth subsalicylate 262 MG chewable tablet Commonly known as:  PEPTO BISMOL Chew 524 mg by mouth as needed for indigestion.   cetirizine 10 MG tablet Commonly known as:  ZYRTEC Take 10 mg by mouth daily.   DEBLITANE 0.35 MG tablet Generic drug:  norethindrone Take 1 tablet by mouth daily.   multivitamin tablet Take 1 tablet by mouth daily.   PROBIOTIC PO Take 1 tablet by mouth daily.   rosuvastatin 20 MG tablet Commonly known as:  CRESTOR Take 20 mg by mouth daily.   vitamin C 100 MG tablet Take 100 mg by mouth daily.   Vitamin D3 2000 units Tabs Take 2,000 Units by mouth once a week. Mondays       Disposition and follow-up:   Ms.Bonnie Adkins was discharged from [facility] in stable condition.  At the hospital follow up visit please address:  1.  Anxiety- patient Bonnie Adkins benefit from a trial of SSRIs and benzodiazepines   2.  Labs / imaging needed at time of follow-up: TEE  3.  Pending labs/ test needing follow-up: none  Follow-up Appointments: Follow-up Information    Yates Decamp, MD Follow up.   Specialty:  Cardiology Why:  Our office will call you for an appointment Contact information: [address] [place] [address] [phone]        Bonnie Inch, MD Follow up.    Specialty:  Family Medicine Why:  Follow up in 2-4 weeks  Contact information: [address] [place] [address] [phone]        [facility]. Schedule an appointment as soon as possible for a visit in 2 week(s).   Contact information: [address]     [address] [place] [address] [phone]          Hospital Course by problem list: Active Problems:   TIA (transient ischemic attack)   1. Transient ischemic attack Patient presented to the ED with right arm and leg numbness with associated weakness. She reports the episode of numbness lasted for 10 minutes followed by weakness.  When patient was evaluated in the ED her weakness had resolved.  Patient denied slurred speech or changes in vision.  On exam Cranial nerves II-XII were intact. Motor and sensation was intact bilaterally in upper and lower extremities bilaterally. Patient was admitted to the internal medicine teaching service for stroke workup.  Neurology was consulted.  She was started on aspirin 325 mg and continued on 81 mg.  CTA of the head and neck and MRI of the brain showed no definite acute abnormalities.  Patient's lipid profile showed a total 10 and LDL of 50. Patient is on Crestor for hyperlipidemia and was continued on admission.  Her hemoglobin A1c was 5.  Patient had a TTE which did not show a PFO.  Transcranial Doppler with bubbles showed a small PFO.  The results were discussed with her cardiologist and patient will be contacted after discharge to discuss doing a trans-esophageal echocardiogram.  Patient was discharged on aspirin 81 mg.  2. Hyperlipidemia Likely hereditary or mutation related given age and BMI.  LDLwas 50 and total cholesterol 110.  She was continued on her rosuvastatin during admission.  3. 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  Subcutaneous Lovenox was given for DVT prophylaxis. Patient was worked up for other  coagulopathies. Lupus anticoagulant was negative. Homocystine was negative. Beta-2-glycoprotein IgG and IgM were negative. Cardiolipin antibodies for IgG and IgA were negative and IgM was indeterminate.    Discharge Vitals:   BP 107/65 (BP Location: Left Arm)   Pulse (!) 103   Temp 98.1 F (36.7 C) (Oral)   Resp 18   Ht  (1.6 m)   Wt 115 lb (52.2 kg)   SpO2 98%   BMI 20.37 kg/m   Pertinent Labs, Studies, and Procedures:  See Above  Discharge Instructions: Discharge Instructions    Diet - low sodium heart healthy    Complete by:  As directed    Discharge instructions    Complete by:  As directed    Please follow up with Cardiology, Neurology and your family doctor after discharge. Your cardiologist will call you to make an appointment to discuss the transesophageal echocardiogram Please call the neurologist (information in your discharge packet) to follow up on your TIA episode.   Please follow up with your family doctor as well in the next 2-4 weeks   Increase activity slowly    Complete by:  As directed       Signed: Camelia Phenes, DO 10/27/2016, 4:17 PM   Pager: [phone]

## 2016-10-27 NOTE — Telephone Encounter (Signed)
Noted thank you

## 2016-10-27 NOTE — Telephone Encounter (Signed)
Pt called back to inform that she was able to get a note from the hospital.  There is nothing else that Dr Pearlean Brownie needs to do.

## 2016-10-28 SURGERY — ECHOCARDIOGRAM, TRANSESOPHAGEAL
Anesthesia: Moderate Sedation

## 2016-12-05 NOTE — H&P (Signed)
OFFICE VISIT NOTES COPIED TO EPIC FOR DOCUMENTATION  . History of Present Illness Suzy Bouchard FNP-C; 11/06/2016 10:04 AM) Patient words: Last OV 11/06/2015; FU from hospital for possible tia, pt states she was sitting and her right leg and arm was tingling, echo (bubble study) showed PFO, pt had labs done at [facility].  The patient is a 21 year old female who presents for a follow-up for Palpitations. She was initially seen for evaluation of palpitations and hyperlipidemia. She wore an event monitor and had an echocardiogram. Echocardiogram was completely normal and the monitor revealed symptomatic sinus tachycardia without other arrhythmias. Symptoms were attributed to stress as they typically occurred when she was at school and never on the weekends and during breaks.   We have continued to see her for hyperlipidemia. Baseline lipid panel revealed an LDL of 235, suggestive of familial hyperlipidemia. She was started on 80 mg atorvastatin with complete normalization of lipids. She had a mild bump in ALT at 64 with otherwise normal hepatic function. She is tolerating Crestor well.  Patient presents today for hospital follow-up. She recently reports an episode while at church while her right side went numb and tingly, she was then taken to the emergency room for further evaluation. Other times she was seen in the emergency room symptoms had resolved however she was admitted for further observation. Symptoms were suggestive of TIA, CT of the head and MRI were negative. She underwent transcranial study that suggested possible PFO or ASD. She now presents for further follow-up.   Problem List/Past Medical Alysia Penna Reader; 11/04/2016 2:02 PM) Intermittent palpitations (R00.2)  Event Monitor 03/05/2015: Symptomatic transmission reveal S. Tachycarida. No other significant arrhthmias. Shortness of breath at rest (R06.02)  Echo- 03/11/2015 1. Left ventricle cavity is normal in size. Normal global  wall motion. Calculated EF 59%. 2. Study is with in normal limits. 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 No known Heart Conditions Father  In good health. No known Heart Conditions Brother 2  both younger  Social History Alysia Penna Reader; 11/04/2016 2:02 PM) Current tobacco use  Never smoker. Non Drinker/No Alcohol Use  Marital status  Single. Number of Children  0. Living Situation  Lives with parents.  Past Surgical History Alysia Penna Reader; 11/04/2016 2:02 PM) Tubes in Ears  as a child Jaw procedure (Arthrocentesis) 2013  Medication History Victorino Dike Sergeant; 11/04/2016 2:17 PM) Crestor (20MG  Tablet, 1 (one) Tablet Oral daily, Taken starting 05/10/2016) Active. ZyrTEC Allergy (10MG  Capsule, 1 Oral daily) Active. Probiotic (1 Oral daily) Active. Multivitamin Gummies Adult (2 Oral daily) Active. Deblitane (0.35MG  Tablet, 1 Oral daily) Active. Aspirin (81MG  Tablet DR, 1 Oral daily) Active. Vitamin D (2000UNIT Capsule, 1 Oral every monday) Active.  Diagnostic Studies History Suzy Bouchard, FNP-C; 11/04/2016 2:44 PM) Loney Laurence  10/25/2016: Cholesterol 110, triglycerides 35, HDL 53, LDL 50. Hemoglobin A1c is 5.0%. CBC normal. Potassium 4.2, creatinine 0.56, BMP normal. Labs 10/24/2016: Prothrombin gene mutation positive. Others Hypercoagulable labs pending. CBC, BMP normal. Labs 01/13/2016: Total cholesterol 140, triglycerides 61, HDL 56, LDL 72. Vitamin D 36.2. LFT normal. 06/18/2015: Total cholesterol 147, triglycerides 64, HDL 51, LDL 83, LDL particle # 856, ALT 64, hepatic function otherwise normal 04/22/2015: Total cholesterol 319, triglycerides 231, HDL 34, direct LDL 260, ApoB 235, ApoA 129, sdLDL 122, Lp(a) 67,  LpPLA2 normal,  homocystine normal 12/13/2014: Total cholesterol 304, triglycerides 133, HDL 42, LDL 235, CBC normal, creatinine 0.61, CMP normal, vitamin D 31.9, TSH 2.010 Event Monitor 03/05/2015 Symptomatic transmission reveal S. Tachycarida. No other significant arrhthmias. Echocardiogram 03/11/2015 1. Left ventricle cavity is normal in size. Normal global wall motion. Calculated EF 59%. 2. Study is with in normal limits.    Review of Systems Suzy Bouchard, FNP-C; 11/06/2016 10:4 AM) General Not Present- Anorexia, Fatigue and Fever. Respiratory Not Present- Cough, Decreased Exercise Tolerance and Difficulty Breathing on Exertion. Cardiovascular Present- Palpitations. Not Present- Chest Pain, Claudications, Edema, Orthopnea and Paroxysmal Nocturnal Dyspnea. Gastrointestinal Not Present- Change in Bowel Habits, Constipation and Nausea. Neurological Not Present- Focal Neurological Symptoms. Endocrine Not Present- Appetite Changes, Cold Intolerance and Heat Intolerance. Hematology Not Present- Anemia, Petechiae and Prolonged Bleeding.  Vitals Alysia Penna Reader; 11/04/2016 2:28 PM) 11/04/2016 2:09 PM Weight: 116.31 lb Height: 63in Body Surface Area: 1.54 m Body Mass Index: 20.6 kg/m  Pulse: 76 (Regular)  P.OX: 100% (Room air) BP: 114/75 (Sitting, Left Arm, Standard)       Physical Exam Suzy Bouchard, FNP-C; 11/06/2016 10:4 AM) General Mental Status-Alert. General Appearance-Cooperative, Appears stated age, Not in acute distress. Orientation-Oriented X3. Build & Nutrition-Lean and Moderately built.  Head and Neck Thyroid Gland Characteristics - no palpable nodules, no palpable enlargement.  Chest and Lung Exam Palpation Tender - No chest wall tenderness. Auscultation Breath sounds - Clear.  Cardiovascular Inspection Jugular vein - Right - No Distention. Auscultation Heart Sounds - S1 WNL, S2 WNL and No gallop present. Murmurs & Other Heart Sounds - Murmur - No  murmur.  Abdomen Palpation/Percussion Normal exam - Non Tender and No hepatosplenomegaly. Auscultation Normal exam - Bowel sounds normal.  Peripheral Vascular Lower Extremity Inspection - Left - No Pigmentation, No Varicose veins. Right - No Pigmentation, No Varicose veins. Palpation - Edema - Left - No edema. Right - No edema. Femoral pulse - Left - Normal. Right - Normal. Popliteal pulse - Left - Normal. Right - Normal. Dorsalis pedis pulse - Left - Normal. Right - Normal. Posterior tibial pulse - Left - Normal. Right - Normal. Carotid arteries - Left-No Carotid bruit. Carotid arteries - Right-No Carotid bruit. Abdomen-No prominent abdominal aortic pulsation, No epigastric bruit.  Neurologic Motor-Grossly intact without any focal deficits.  Musculoskeletal Global Assessment Left Lower Extremity - normal range of motion without pain. Right Lower Extremity - normal range of motion without pain.    Assessment & Plan Suzy Bouchard FNP-C; 11/06/2016 10:00 AM) History of TIA (transient ischemic attack) (Z86.73) Impression: Echo 10/25/2016: The cavity size was normal. Wall thickness was normal. Systolic function was vigorous. The estimated ejection fraction was in the range of 65% to 70%. Wall motion was normal; there were no regional wall motion abnormalities. Left ventricular diastolic function parameters were normal. Left atrium: The atrium was normal in size. Inferior vena cava: The vessel was normal in size. The respirophasic diameter changes were in the normal range (>= 50%), consistent with normal central venous pressure. 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 I discussed primary/secondary prevention and also dietary counseling was done. Patient presents for hospital follow-up after she  was found to have possible TIA.She denies any reoccurence of symptoms since discharge. She underwent transcranial duplex with bubble study that indicated possible PFO or ASD. Echocardiogram was normal. Schedule for TEE  to better evaluate the structural abnormality. I have explained risks, benefits,alternatives to TEE, including but not limited to rare esophageal perforation, bleeding, aspiration pneumonia. Patient and her mother are agreeable and verbalized understanding the risks. I will have her follow-up after the procedure for further recommendations and reevaluation.  CC: Dr. Antony HasteMichael Badger  Signed electronically by Suzy BouchardAshton H Kelley, FNP-C (11/06/2016 10:04 AM)

## 2016-12-07 ENCOUNTER — Ambulatory Visit (HOSPITAL_COMMUNITY)
Admission: RE | Admit: 2016-12-07 | Discharge: 2016-12-07 | Disposition: A | Discharge status: Home / Self Care | Payer: 59 | Source: Ambulatory Visit | Attending: Cardiology | Admitting: Cardiology

## 2016-12-07 ENCOUNTER — Encounter (HOSPITAL_COMMUNITY)
Admission: RE | Disposition: A | Discharge status: Home / Self Care | Payer: Self-pay | Source: Ambulatory Visit | Attending: Cardiology

## 2016-12-07 ENCOUNTER — Encounter (HOSPITAL_COMMUNITY): Payer: Self-pay | Admitting: *Deleted

## 2016-12-07 DIAGNOSIS — E785 Hyperlipidemia, unspecified: Secondary | ICD-10-CM | POA: Insufficient documentation

## 2016-12-07 DIAGNOSIS — R931 Abnormal findings on diagnostic imaging of heart and coronary circulation: Secondary | ICD-10-CM | POA: Insufficient documentation

## 2016-12-07 DIAGNOSIS — Z7982 Long term (current) use of aspirin: Secondary | ICD-10-CM | POA: Diagnosis not present

## 2016-12-07 DIAGNOSIS — E559 Vitamin D deficiency, unspecified: Secondary | ICD-10-CM | POA: Diagnosis not present

## 2016-12-07 DIAGNOSIS — Z8673 Personal history of transient ischemic attack (TIA), and cerebral infarction without residual deficits: Secondary | ICD-10-CM

## 2016-12-07 HISTORY — PX: TEE WITHOUT CARDIOVERSION: SHX5443

## 2016-12-07 SURGERY — ECHOCARDIOGRAM, TRANSESOPHAGEAL
Anesthesia: Moderate Sedation

## 2016-12-07 MED ORDER — DIPHENHYDRAMINE HCL 50 MG/ML IJ SOLN
INTRAMUSCULAR | Status: DC | PRN
  Administered 2016-12-07: 12.5 mg via INTRAVENOUS

## 2016-12-07 MED ORDER — BUTAMBEN-TETRACAINE-BENZOCAINE 2-2-14 % EX AERO
INHALATION_SPRAY | CUTANEOUS | Status: DC | PRN
  Administered 2016-12-07: 2 via TOPICAL

## 2016-12-07 MED ORDER — FENTANYL CITRATE (PF) 100 MCG/2ML IJ SOLN
INTRAMUSCULAR | Status: DC | PRN
  Administered 2016-12-07 (×2): 25 ug via INTRAVENOUS

## 2016-12-07 MED ORDER — FENTANYL CITRATE (PF) 100 MCG/2ML IJ SOLN
INTRAMUSCULAR | Status: AC
  Filled 2016-12-07: qty 2

## 2016-12-07 MED ORDER — MIDAZOLAM HCL 10 MG/2ML IJ SOLN
INTRAMUSCULAR | Status: DC | PRN
  Administered 2016-12-07: 2 mg via INTRAVENOUS
  Administered 2016-12-07: 1 mg via INTRAVENOUS

## 2016-12-07 MED ORDER — DIPHENHYDRAMINE HCL 50 MG/ML IJ SOLN
INTRAMUSCULAR | Status: AC
  Filled 2016-12-07: qty 1

## 2016-12-07 MED ORDER — SODIUM CHLORIDE 0.9 % IV SOLN
INTRAVENOUS | Status: DC
  Administered 2016-12-07: 11:00:00 via INTRAVENOUS

## 2016-12-07 MED ORDER — MIDAZOLAM HCL 5 MG/ML IJ SOLN
INTRAMUSCULAR | Status: AC
  Filled 2016-12-07: qty 2

## 2016-12-07 NOTE — CV Procedure (Signed)
TEE: Under moderate sedation, TEE was performed without complications: LV: Normal. Normal EF. RV: Normal LA: Normal. Left atrial appendage: Normal without thrombus. Normal function. Inter atrial septum is intact without defect. Double contrast study negative for atrial level shunting. No late appearance of bubbles either. RA: Normal  MV: Normal Trace MR. TV: Normal Trace TR AV: Normal. No AI or AS. PV: Normal. Trace PI.  Thoracic and ascending aorta: Normal without significant plaque or atheromatous changes.  Conscious sedation protocol was followed, I personally administered conscious sedation and monitored the patient. Patient received 3 milligrams of Versed and 50 fentanyl and 12.5 mg Benadryl . Patient tolerated the procedure well and there was no complication from conscious sedation. Time administered was  15 minutes.

## 2016-12-07 NOTE — Discharge Instructions (Signed)
TEE ° °YOU HAD AN CARDIAC PROCEDURE TODAY: Refer to the procedure report and other information in the discharge instructions given to you for any specific questions about what was found during the examination. If this information does not answer your questions, please call [facility] office at [phone] to clarify.  ° °DIET: Your first meal following the procedure should be a light meal and then it is ok to progress to your normal diet. A half-sandwich or bowl of soup is an example of a good first meal. Heavy or fried foods are harder to digest and [place]lin make you feel nauseous or bloated. Drink plenty of fluids but you should avoid alcoholic beverages for 24 hours. If you had a esophageal dilation, please see attached instructions for diet.  ° °ACTIVITY: Your care partner should take you home directly after the procedure. You should plan to take it easy, moving slowly for the rest of the day. You can resume normal activity the day after the procedure however YOU SHOULD NOT DRIVE, use power tools, machinery or perform tasks that involve climbing or major physical exertion for 24 hours (because of the sedation medicines used during the test).  ° °SYMPTOMS TO REPORT IMMEDIATELY: °A cardiologist can be reached at any hour. Please call [phone] for any of the following symptoms:  °Vomiting of blood or coffee ground material  °New, significant abdominal pain  °New, significant chest pain or pain under the shoulder blades  °Painful or persistently difficult swallowing  °New shortness of breath  °Black, tarry-looking or red, bloody stools ° °FOLLOW UP:  °Please also call with any specific questions about appointments or follow up tests. ° ° °

## 2016-12-08 ENCOUNTER — Encounter (HOSPITAL_COMMUNITY): Payer: Self-pay | Admitting: Cardiology

## 2018-04-10 ENCOUNTER — Emergency Department (HOSPITAL_COMMUNITY): Payer: BLUE CROSS/BLUE SHIELD

## 2018-04-10 ENCOUNTER — Other Ambulatory Visit: Payer: Self-pay

## 2018-04-10 ENCOUNTER — Encounter (HOSPITAL_COMMUNITY): Payer: Self-pay

## 2018-04-10 ENCOUNTER — Observation Stay (HOSPITAL_COMMUNITY)
Admission: EM | Admit: 2018-04-10 | Discharge: 2018-04-12 | Disposition: A | Discharge status: Home / Self Care | Payer: BLUE CROSS/BLUE SHIELD | Attending: Internal Medicine | Admitting: Internal Medicine

## 2018-04-10 DIAGNOSIS — R3129 Other microscopic hematuria: Secondary | ICD-10-CM | POA: Insufficient documentation

## 2018-04-10 DIAGNOSIS — F329 Major depressive disorder, single episode, unspecified: Secondary | ICD-10-CM | POA: Diagnosis not present

## 2018-04-10 DIAGNOSIS — Z8673 Personal history of transient ischemic attack (TIA), and cerebral infarction without residual deficits: Secondary | ICD-10-CM | POA: Diagnosis not present

## 2018-04-10 DIAGNOSIS — N3000 Acute cystitis without hematuria: Secondary | ICD-10-CM

## 2018-04-10 DIAGNOSIS — I4581 Long QT syndrome: Secondary | ICD-10-CM | POA: Insufficient documentation

## 2018-04-10 DIAGNOSIS — D682 Hereditary deficiency of other clotting factors: Secondary | ICD-10-CM | POA: Insufficient documentation

## 2018-04-10 DIAGNOSIS — Z7984 Long term (current) use of oral hypoglycemic drugs: Secondary | ICD-10-CM | POA: Diagnosis not present

## 2018-04-10 DIAGNOSIS — A419 Sepsis, unspecified organism: Secondary | ICD-10-CM

## 2018-04-10 DIAGNOSIS — N390 Urinary tract infection, site not specified: Secondary | ICD-10-CM | POA: Insufficient documentation

## 2018-04-10 DIAGNOSIS — Z888 Allergy status to other drugs, medicaments and biological substances status: Secondary | ICD-10-CM | POA: Insufficient documentation

## 2018-04-10 DIAGNOSIS — K580 Irritable bowel syndrome with diarrhea: Secondary | ICD-10-CM | POA: Insufficient documentation

## 2018-04-10 DIAGNOSIS — Z79899 Other long term (current) drug therapy: Secondary | ICD-10-CM | POA: Insufficient documentation

## 2018-04-10 DIAGNOSIS — E785 Hyperlipidemia, unspecified: Secondary | ICD-10-CM | POA: Diagnosis not present

## 2018-04-10 DIAGNOSIS — Z7982 Long term (current) use of aspirin: Secondary | ICD-10-CM | POA: Diagnosis not present

## 2018-04-10 DIAGNOSIS — R7989 Other specified abnormal findings of blood chemistry: Secondary | ICD-10-CM | POA: Insufficient documentation

## 2018-04-10 DIAGNOSIS — Z9889 Other specified postprocedural states: Secondary | ICD-10-CM | POA: Diagnosis not present

## 2018-04-10 DIAGNOSIS — R300 Dysuria: Secondary | ICD-10-CM

## 2018-04-10 DIAGNOSIS — J181 Lobar pneumonia, unspecified organism: Secondary | ICD-10-CM | POA: Diagnosis present

## 2018-04-10 DIAGNOSIS — F32A Depression, unspecified: Secondary | ICD-10-CM | POA: Diagnosis present

## 2018-04-10 DIAGNOSIS — E282 Polycystic ovarian syndrome: Secondary | ICD-10-CM | POA: Diagnosis not present

## 2018-04-10 DIAGNOSIS — R55 Syncope and collapse: Secondary | ICD-10-CM | POA: Diagnosis not present

## 2018-04-10 DIAGNOSIS — I951 Orthostatic hypotension: Secondary | ICD-10-CM | POA: Diagnosis not present

## 2018-04-10 DIAGNOSIS — R9431 Abnormal electrocardiogram [ECG] [EKG]: Secondary | ICD-10-CM

## 2018-04-10 DIAGNOSIS — J189 Pneumonia, unspecified organism: Secondary | ICD-10-CM

## 2018-04-10 DIAGNOSIS — K589 Irritable bowel syndrome without diarrhea: Secondary | ICD-10-CM

## 2018-04-10 HISTORY — DX: Polycystic ovarian syndrome: E28.2

## 2018-04-10 HISTORY — DX: Irritable bowel syndrome, unspecified: K58.9

## 2018-04-10 LAB — URINALYSIS, ROUTINE W REFLEX MICROSCOPIC
BACTERIA UA: NONE SEEN
BILIRUBIN URINE: NEGATIVE
Glucose, UA: NEGATIVE mg/dL
Ketones, ur: 20 mg/dL — AB
Leukocytes, UA: NEGATIVE
Nitrite: NEGATIVE
PH: 6 (ref 5.0–8.0)
PROTEIN: NEGATIVE mg/dL
Specific Gravity, Urine: 1.009 (ref 1.005–1.030)

## 2018-04-10 LAB — COMPREHENSIVE METABOLIC PANEL
ALT: 33 U/L (ref 0–44)
ANION GAP: 13 (ref 5–15)
AST: 32 U/L (ref 15–41)
Albumin: 4.7 g/dL (ref 3.5–5.0)
Alkaline Phosphatase: 41 U/L (ref 38–126)
BILIRUBIN TOTAL: 0.8 mg/dL (ref 0.3–1.2)
BUN: 7 mg/dL (ref 6–20)
CHLORIDE: 108 mmol/L (ref 98–111)
CO2: 21 mmol/L — ABNORMAL LOW (ref 22–32)
Calcium: 9.6 mg/dL (ref 8.9–10.3)
Creatinine, Ser: 0.55 mg/dL (ref 0.44–1.00)
Glucose, Bld: 82 mg/dL (ref 70–99)
POTASSIUM: 4.1 mmol/L (ref 3.5–5.1)
Sodium: 142 mmol/L (ref 135–145)
TOTAL PROTEIN: 7.7 g/dL (ref 6.5–8.1)

## 2018-04-10 LAB — I-STAT BETA HCG BLOOD, ED (MC, WL, AP ONLY)

## 2018-04-10 LAB — CBC
HEMATOCRIT: 43.3 % (ref 36.0–46.0)
HEMOGLOBIN: 13.8 g/dL (ref 12.0–15.0)
MCH: 28.2 pg (ref 26.0–34.0)
MCHC: 31.9 g/dL (ref 30.0–36.0)
MCV: 88.5 fL (ref 78.0–100.0)
PLATELETS: 300 10*3/uL (ref 150–400)
RBC: 4.89 MIL/uL (ref 3.87–5.11)
RDW: 12.3 % (ref 11.5–15.5)
WBC: 14.5 10*3/uL — AB (ref 4.0–10.5)

## 2018-04-10 LAB — MAGNESIUM: Magnesium: 1.9 mg/dL (ref 1.7–2.4)

## 2018-04-10 LAB — WET PREP, GENITAL
CLUE CELLS WET PREP: NONE SEEN
Sperm: NONE SEEN
Trich, Wet Prep: NONE SEEN
Yeast Wet Prep HPF POC: NONE SEEN

## 2018-04-10 LAB — LIPASE, BLOOD: LIPASE: 22 U/L (ref 11–51)

## 2018-04-10 LAB — I-STAT TROPONIN, ED: Troponin i, poc: 0 ng/mL (ref 0.00–0.08)

## 2018-04-10 LAB — D-DIMER, QUANTITATIVE (NOT AT ARMC): D DIMER QUANT: 0.61 ug{FEU}/mL — AB (ref 0.00–0.50)

## 2018-04-10 MED ORDER — SODIUM CHLORIDE 0.9 % IV BOLUS
1000.0000 mL | Freq: Once | INTRAVENOUS | Status: AC
  Administered 2018-04-10: 1000 mL via INTRAVENOUS

## 2018-04-10 MED ORDER — IOPAMIDOL (ISOVUE-370) INJECTION 76%
100.0000 mL | Freq: Once | INTRAVENOUS | Status: AC | PRN
  Administered 2018-04-11: 100 mL via INTRAVENOUS

## 2018-04-10 MED ORDER — HYDROXYZINE HCL 25 MG PO TABS: 25.0000 mg | ORAL_TABLET | Freq: Once | ORAL | Status: DC

## 2018-04-10 NOTE — ED Triage Notes (Signed)
Pt seen yesterday for UTI, was prescribed Macrobid and a Zpack for URI.   Pt went to work this morning not feeling well, has been nauseous for 2 months, while at work felt dizzy, shortness of breath, and felt like she was going to pass out.  Pt states she has had panic attacks in the past but today is different.

## 2018-04-10 NOTE — H&P (Signed)
History and Physical    [facility] XBM:[id number] DOB: [birth year] DOA: 04/10/2018  Referring MD/NP/PA:   PCP: Eartha Inch, MD   Patient coming from:  The patient is coming from home.  At baseline, pt is independent for most of ADL.     Chief Complaint: Cough, shortness of breath, diarrhea, dysuria, near syncope  HPI: Bonnie Adkins is a 22 y.o. female with medical history significant of hyperlipidemia, POCS, IBS, depression, factor II deficiency, who presents with cough, shortness of breath, diarrhea, near syncope.  Patient states that she has been having cough since last week. She has yellow-colored sputum production.  She has intermittent mild shortness of breath, but no chest pain.  No fever chills.  She also has sore throat.  She states that she had 1 day of dysuria and increased urinary frequency.  She was seen in the urgent care yesterday, and given prescription of Macrobid for UTI and 1 dose of azithromycin.  She states that her dysuria has resolved.  She was told that her urine culture did not grow anything, they also did urinalysis, but patient does not know the results of urinalysis.  Patient states that she has hx of IBS and chronic diarrhea, which has not changed recently.  She has been having nausea in the past 2 months, but no vomiting or abdominal pain.  She states that she is taking metformin for PCOS.  She states that her nausea is likely related to metformin use.  She states that in the past several days she has been having dizziness and lightheadedness. Today she feels like she is going to pass out, but did not.  No fall.  No unilateral weakness, numbness or tingling his extremities.  No facial droop or slurred speech. TIA is on her medical problem list, but patient states that her cardiologist, Dr. Jacinto Halim told her that it was because of panic attack, not TIA.  ED Course: pt was found to have WBC 14.5, positive d-dimer 0.61, negative urinalysis, lipase 22, WBC 14.5, electrolytes  renal function okay, temperature normal, tachycardia, tachypnea, oxygen saturation 100% on room air.  Chest x-ray is negative.  CT angiogram is negative for PE, but showed pulmonary consolidation in the right lower lobe consistent with pneumonia. Pt is placed on tele bed for obs.  Review of Systems:   General: no fevers, chills, no body weight gain, has poor appetite, has fatigue HEENT: no blurry vision, hearing changes or sore throat Respiratory: has dyspnea, coughing, no wheezing CV: no chest pain, no palpitations GI: has nausea, diarrhea, no constipation, vomiting, abdominal pain GU: has dysuria, burning on urination, increased urinary frequency, no hematuria  Ext: no leg edema Neuro: no unilateral weakness, numbness, or tingling, no vision change or hearing loss. Has near syncope. Skin: no rash, no skin tear. MSK: No muscle spasm, no deformity, no limitation of range of movement in spin Heme: No easy bruising.  Travel history: No recent long distant travel.  Allergy:  Allergies  Allergen Reactions  . Pseudoephedrine Hcl Palpitations    Past Medical History:  Diagnosis Date  . Abdominal pain, recurrent   . Diarrhea   . Factor II deficiency (HCC)   . High cholesterol   . Hypoglycemia   . IBS (irritable bowel syndrome)   . PCOS (polycystic ovarian syndrome)     Past Surgical History:  Procedure Laterality Date  . MANDIBLE SURGERY    . TEE WITHOUT CARDIOVERSION N/A 12/07/2016   Procedure: TRANSESOPHAGEAL ECHOCARDIOGRAM (TEE);  Surgeon: Yates Decamp,  MD;  Location: [facility];  Service: Cardiovascular;  Laterality: N/A;  . TYMPANOSTOMY TUBE PLACEMENT      Social History:  reports that she has never smoked. She has never used smokeless tobacco. She reports that she does not drink alcohol or use drugs.  Family History:  Family History  Problem Relation Age of Onset  . Venous thrombosis Mother      Prior to Admission medications   Medication Sig Start Date End Date  Taking? Authorizing Provider  acetaminophen (TYLENOL) 500 MG tablet Take 500 mg by mouth every 6 (six) hours as needed for mild pain. For pain   Yes [provider]  Ascorbic Acid (VITAMIN C) 100 MG tablet Take 100 mg by mouth at bedtime.    Yes [provider]  aspirin EC 81 MG EC tablet Take 1 tablet (81 mg total) by mouth daily. Patient taking differently: Take 81 mg by mouth at bedtime.  10/26/16  Yes Hoffman, Jessica Ratliff, DO  azithromycin (ZITHROMAX) 250 MG tablet Take 250 mg by mouth daily.  04/10/18 04/14/18 Yes [provider]  benzonatate (TESSALON) 100 MG capsule Take 100 mg by mouth 3 (three) times daily as needed for cough.  04/09/18  Yes [provider]  cetirizine (ZYRTEC) 10 MG tablet Take 10 mg by mouth at bedtime.    Yes [provider]  metFORMIN (GLUCOPHAGE-XR) 500 MG 24 hr tablet Take 500 mg by mouth 2 (two) times daily.  03/14/18  Yes [provider]  Multiple Vitamin (MULTIVITAMIN) tablet Take 1 tablet by mouth at bedtime.    Yes [provider]  nitrofurantoin, macrocrystal-monohydrate, (MACROBID) 100 MG capsule Take 100 mg by mouth 2 (two) times daily.   Yes [provider]  norethindrone (DEBLITANE) 0.35 MG tablet Take 1 tablet by mouth at bedtime.  04/07/16  Yes [provider]  Probiotic Product (PROBIOTIC PO) Take 1 tablet by mouth at bedtime.    Yes [provider]  rosuvastatin (CRESTOR) 20 MG tablet Take 20 mg by mouth at bedtime.  10/10/16  Yes [provider]  sertraline (ZOLOFT) 50 MG tablet Take 50 mg by mouth at bedtime.   Yes [provider]    Physical Exam: Vitals:   04/10/18 1930 04/10/18 2030 04/11/18 0107 04/11/18 0158  BP: 111/89 117/78 105/62 106/74  Pulse: (!) 104 99 99 92  Resp: 17 20 18 20   Temp:    99.5 F (37.5 C)  TempSrc:    Oral  SpO2: 99% 100% 99% 98%  Weight:    59.9 kg  Height:    5\' 3"  (1.6 m)   General: Not in acute distress.   Dry mucous membrane. HEENT:       Eyes: PERRL, EOMI, no scleral icterus.       ENT: No discharge from the ears and nose, no pharynx injection, no tonsillar enlargement.        Neck: No JVD, no bruit, no mass felt. Heme: No neck lymph node enlargement. Cardiac: S1/S2, RRR, No murmurs, No gallops or rubs. Respiratory: No rales, wheezing, rhonchi or rubs. GI: Soft, nondistended, nontender, no rebound pain, no organomegaly, BS present. GU: No hematuria Ext: No pitting leg edema bilaterally. 2+DP/PT pulse bilaterally. Musculoskeletal: No joint deformities, No joint redness or warmth, no limitation of ROM in spin. Skin: No rashes.  Neuro: Alert, oriented X3, cranial nerves II-XII grossly intact, moves all extremities normally. Muscle strength 5/5 in all extremities, sensation to light touch intact. Brachial reflex 2+  bilaterally. Knee reflex 1+ bilaterally.  Psych: Patient is not psychotic, no suicidal or hemocidal ideation.  Labs on Admission: I have personally reviewed following labs and imaging studies  CBC: Recent Labs  Lab 04/10/18 1404  WBC 14.5*  HGB 13.8  HCT 43.3  MCV 88.5  PLT 300   Basic Metabolic Panel: Recent Labs  Lab 04/10/18 1404 04/10/18 1807  NA 142  --   K 4.1  --   CL 108  --   CO2 21*  --   GLUCOSE 82  --   BUN 7  --   CREATININE 0.55  --   CALCIUM 9.6  --   MG  --  1.9   GFR: Estimated Creatinine Clearance: 91.2 mL/min (by C-G formula based on SCr of 0.55 mg/dL). Liver Function Tests: Recent Labs  Lab 04/10/18 1404  AST 32  ALT 33  ALKPHOS 41  BILITOT 0.8  PROT 7.7  ALBUMIN 4.7   Recent Labs  Lab 04/10/18 1404  LIPASE 22   No results for input(s): AMMONIA in the last 168 hours. Coagulation Profile: No results for input(s): INR, PROTIME in the last 168 hours. Cardiac Enzymes: No results for input(s): CKTOTAL, CKMB, CKMBINDEX, TROPONINI in the last 168 hours. BNP (last 3 results) No results for input(s): PROBNP in the last 8760  hours. HbA1C: No results for input(s): HGBA1C in the last 72 hours. CBG: No results for input(s): GLUCAP in the last 168 hours. Lipid Profile: No results for input(s): CHOL, HDL, LDLCALC, TRIG, CHOLHDL, LDLDIRECT in the last 72 hours. Thyroid Function Tests: No results for input(s): TSH, T4TOTAL, FREET4, T3FREE, THYROIDAB in the last 72 hours. Anemia Panel: No results for input(s): VITAMINB12, FOLATE, FERRITIN, TIBC, IRON, RETICCTPCT in the last 72 hours. Urine analysis:    Component Value Date/Time   COLORURINE YELLOW 04/10/2018 1353   APPEARANCEUR CLEAR 04/10/2018 1353   LABSPEC 1.009 04/10/2018 1353   PHURINE 6.0 04/10/2018 1353   GLUCOSEU NEGATIVE 04/10/2018 1353   HGBUR LARGE (A) 04/10/2018 1353   BILIRUBINUR NEGATIVE 04/10/2018 1353   KETONESUR 20 (A) 04/10/2018 1353   PROTEINUR NEGATIVE 04/10/2018 1353   UROBILINOGEN 0.2 12/07/2013 1407   NITRITE NEGATIVE 04/10/2018 1353   LEUKOCYTESUR NEGATIVE 04/10/2018 1353   Sepsis Labs: @LABRCNTIP (procalcitonin:4,lacticidven:4) ) Recent Results (from the past 240 hour(s))  Wet prep, genital     Status: Abnormal   Collection Time: 04/10/18  6:32 PM  Result Value Ref Range Status   Yeast Wet Prep HPF POC NONE SEEN NONE SEEN Final   Trich, Wet Prep NONE SEEN NONE SEEN Final   Clue Cells Wet Prep HPF POC NONE SEEN NONE SEEN Final   WBC, Wet Prep HPF POC MANY (A) NONE SEEN Final   Sperm NONE SEEN  Final    Comment: Performed at [facility], [address], [place], [place] [address]     Radiological Exams on Admission: Dg Chest 2 View  Result Date: 04/10/2018 CLINICAL DATA:  Dizziness and weakness.  Urinary tract infection. EXAM: CHEST - 2 VIEW COMPARISON:  None. FINDINGS: The heart size and mediastinal contours are within normal limits. Both lungs are clear. The visualized skeletal structures are unremarkable. IMPRESSION: No active cardiopulmonary disease. Electronically Signed   By: Elsie Stain M.D.   On: 04/10/2018  17:09   Ct Angio Chest Pe W And/or Wo Contrast  Result Date: 04/11/2018 CLINICAL DATA:  Upper respiratory infection with malaise. Patient felt dizziness and dyspnea at work today. EXAM: CT ANGIOGRAPHY  CHEST WITH CONTRAST TECHNIQUE: Multidetector CT imaging of the chest was performed using the standard protocol during bolus administration of intravenous contrast. Multiplanar CT image reconstructions and MIPs were obtained to evaluate the vascular anatomy. CONTRAST:  ISOVUE-370 IOPAMIDOL (ISOVUE-370) INJECTION 76% COMPARISON:  Same day CXR FINDINGS: Cardiovascular: Normal heart size without pericardial effusion. Nonaneurysmal thoracic aorta without dissection. No acute pulmonary embolus. Satisfactory pulmonary opacification to the segmental and subsegmental level. Streak artifact simulating intraluminal defect in the distal right main pulmonary embolus is noted. Mediastinum/Nodes: Residual thymic tissue noted anteriorly within the superior mediastinum. No mediastinal adenopathy. Small reactive hilar lymph nodes are identified bilaterally, the largest on the right measuring 6 mm short axis. Unremarkable CT appearance of the esophagus. Lungs/Pleura: Pulmonary consolidation in the posterior basal segment of the right lower lobe consistent with pneumonia. No parapneumonic effusion. No pneumothorax or dominant mass. Upper Abdomen: No acute abnormality. Musculoskeletal: No chest wall abnormality. No acute or significant osseous findings. Stable sclerotic density in the T6 vertebral body likely representing a small bone island. Review of the MIP images confirms the above findings. IMPRESSION: 1. Pulmonary consolidation in the right lower lobe consistent with pneumonia. Small reactive lymph nodes in the hila. 2. No acute pulmonary embolus. Electronically Signed   By: Tollie Eth M.D.   On: 04/11/2018 00:31     EKG: Independently reviewed.  Sinus rhythm, QTC 625, tachycardia, LAE, low voltage, mild T wave  inversion in V3-V4 and 3/aVF.    Assessment/Plan Principal Problem:   Lobar pneumonia (HCC) Active Problems:   UTI (urinary tract infection)   HLD (hyperlipidemia)   Depression   Factor II deficiency (HCC)   Near syncope   Prolonged QT interval   Sepsis (HCC)   PCOS (polycystic ovarian syndrome)   IBS (irritable bowel syndrome)   Sepsis due to lobar pneumonia [facility]): CTA is negative for PE, but showed left lower lobe infiltration, consistent with lobar pneumonia.  Patient admitted critical for sepsis with leukocytosis, tachycardia and tachypnea.  Lactic acid is pending.  Currently hemodynamically stable.  - Will place on telemetry bed for obs. - IV Rocephin and doxycycline - Mucinex for cough  - prn Atrovent nebs SOB - Urine legionella and S. pneumococcal antigen - Follow up blood culture x2, sputum culture and respiratory virus panel - will get Procalcitonin and trend lactic acid level per sepsis protocol - IVF: 2.5L of NS bolus in ED, followed by 125 mL per hour of NS - f/u LE venous doppler to rule out DVT due to positive d-dimer  IBS (irritable bowel syndrome): Has chronic diarrhea -try Bentyl 10 mg tid  Questionable UTI (urinary tract infection): Urinalysis negative.  Patient was diagnosis as a UTI in urgent care, but not know the results of her urinalysis. -Patient is on Rocephin as above -Follow-up blood culture and urine culture -GC chlamydia prob  HLD (hyperlipidemia): -Crestor  Depression:  -Hold Zoloft due to QTC prolongation -PRN Xanax for anxiety  Prolonged QT interval: Etiology is not clear.  Patient received 1 dose of azithromycin yesterday.  She is on Zoloft. Mgnesium is 1.9 which is at the low normal range. -Give 1 gram of magnesium sulfate -Hold Zoloft -Repeat EKG in the morning  Factor II deficiency (HCC): No bleeding tendency. -observe closely  Near syncope: Etiology is not clear.  No focal neurologic findings on physical examination, less  likely to have TIA/stroke.  No seizure activity.  Since patient has chronic diarrhea, has been nauseated recently.  Has decreased oral intake.  Orthostatic status secondary to dehydration is most likely the etiology.  EKG showed QTc prolongation, not sure if patient has any cardiac arrhythmia, but patient does not have any chest pain. - Telemetry monitor -Orthostatic vital sign -IV fluid as above  PCOS: -hold metformin     DVT ppx: none (due to history of factor II deficiency, cannot use heparin/Lovenox; due to positive d-dimer, will need to rule out DVT before using SCD) Code Status: Full code Family Communication:   Yes, patient's husband at bed side Disposition Plan:  Anticipate discharge back to previous home environment Consults called:  none Admission status: Obs / tele    Date of Service 04/11/2018    Lorretta Harp [facility] Pager [phone]  If 7PM-7AM, please contact night-coverage [email or web address] Password [email or web address] 04/11/2018, 2:42 AM

## 2018-04-10 NOTE — ED Provider Notes (Signed)
[facility] EMERGENCY DEPARTMENT Provider Note   CSN: [id number] Arrival date & time: 04/10/18  1329     History   Chief Complaint Chief Complaint  Patient presents with  . Shortness of Breath  . Anxiety    HPI Bonnie Adkins is a 22 y.o. female.  Bonnie Adkins is a 22 y.o. Female with history of factor II deficiency, hyperlipidemia, diarrhea and abdominal pain, who presents to the emergency department for evaluation after near syncopal episode at work this morning.  Patient reports yesterday evening she was seen at urgent care for evaluation of dysuria as well as cough and nasal congestion.  She was started on Macrobid for UTI and prescribed a Z-Pak for URI.  She reports this morning while at work she was not feeling well, reports she has had issues with nausea for the past several months and this was worse today, she started to feel extremely lightheaded, short of breath and felt like she was blacking out.  This occurred while she was sitting, she denies any preceding position changes or strenuous activity.  She did not fall to the ground or hit her head.  She reports since then she is continued to feel unwell and intermittently lightheaded and short of breath.  She does report feeling extremely anxious, and has had panic attacks in the past but this feels different.  She denies any abdominal pain today, no current chest pain.  She reports she has had 4 urinary tract infections in the last month, and before that had only had 2 in her lifetime, she reports they have collected cultures was do not grow back anything, she has not had any pelvic exam done, reports she is on her menstrual cycle currently but denies vaginal discharge, is in a monogamous relationship.  Reports history of factor II deficiency, although she has never had any blood clots.  She is seen by Dr. Jacinto Halim with cardiology, has had echoes and TEE in the past which were normal, history of palpitations which were thought  to be premature complexes, no other arrhythmias or abnormal cardiac diagnoses made.     Past Medical History:  Diagnosis Date  . Abdominal pain, recurrent   . Diarrhea   . Factor II deficiency (HCC)   . High cholesterol   . Hypoglycemia     Patient Active Problem List   Diagnosis Date Noted  . H/O TIA (transient ischemic attack) and stroke 10/24/2016  . Diarrhea   . Abdominal pain, recurrent     Past Surgical History:  Procedure Laterality Date  . MANDIBLE SURGERY    . TEE WITHOUT CARDIOVERSION N/A 12/07/2016   Procedure: TRANSESOPHAGEAL ECHOCARDIOGRAM (TEE);  Surgeon: Yates Decamp, MD;  Location: [facility] ENDOSCOPY;  Service: Cardiovascular;  Laterality: N/A;  . TYMPANOSTOMY TUBE PLACEMENT       OB History   None      Home Medications    Prior to Admission medications   Medication Sig Start Date End Date Taking? Authorizing Provider  acetaminophen (TYLENOL) 500 MG tablet Take 500 mg by mouth every 6 (six) hours as needed for mild pain. For pain    [provider]  Ascorbic Acid (VITAMIN C) 100 MG tablet Take 100 mg by mouth daily.    [provider]  aspirin EC 81 MG EC tablet Take 1 tablet (81 mg total) by mouth daily. 10/26/16   Geralyn Corwin Ratliff, DO  bismuth subsalicylate (PEPTO BISMOL) 262 MG chewable tablet Chew 524 mg by mouth as  needed for indigestion.    [provider]  cetirizine (ZYRTEC) 10 MG tablet Take 10 mg by mouth daily.    [provider]  Cholecalciferol (VITAMIN D3) 2000 units TABS Take 2,000 Units by mouth once a week. Mondays    [provider]  Multiple Vitamin (MULTIVITAMIN) tablet Take 1 tablet by mouth daily.    [provider]  norethindrone (DEBLITANE) 0.35 MG tablet Take 1 tablet by mouth daily. 04/07/16   [provider]  Probiotic Product (PROBIOTIC PO) Take 1 tablet by mouth daily.    [provider]  rosuvastatin (CRESTOR) 20 MG tablet Take 20 mg by mouth daily. 10/10/16    [provider]  sertraline (ZOLOFT) 25 MG tablet Take 25 mg by mouth at bedtime.    [provider]    Family History Family History  Problem Relation Age of Onset  . Venous thrombosis Mother     Social History Social History   Tobacco Use  . Smoking status: Never Smoker  . Smokeless tobacco: Never Used  Substance Use Topics  . Alcohol use: No  . Drug use: No     Allergies   Pseudoephedrine hcl   Review of Systems Review of Systems  Constitutional: Negative for chills and fever.  HENT: Positive for congestion, rhinorrhea and sore throat.   Eyes: Negative for visual disturbance.  Respiratory: Positive for cough and shortness of breath. Negative for chest tightness and wheezing.   Cardiovascular: Positive for palpitations. Negative for chest pain and leg swelling.  Gastrointestinal: Positive for nausea. Negative for abdominal pain and vomiting.  Genitourinary: Positive for dysuria and frequency. Negative for flank pain, hematuria, pelvic pain, vaginal bleeding and vaginal discharge.  Musculoskeletal: Negative for arthralgias, back pain, myalgias and neck pain.  Skin: Negative for color change and rash.  Neurological: Positive for syncope and light-headedness. Negative for dizziness and headaches.     Physical Exam Updated Vital Signs BP 120/89 (BP Location: Right Arm)   Pulse (!) 110   Temp 98 F (36.7 C)   Resp 20   LMP 04/10/2018   SpO2 100%   Physical Exam  Constitutional: She appears well-developed and well-nourished. No distress.  She appears anxious but is in no acute distress  HENT:  Head: Normocephalic and atraumatic.  TMs clear with good landmarks, moderate nasal mucosa edema with clear rhinorrhea, posterior oropharynx clear and moist, with some erythema, no edema or exudates  Eyes: Right eye exhibits no discharge. Left eye exhibits no discharge.  Neck: Neck supple.  Cardiovascular: Regular rhythm, normal heart sounds and intact  distal pulses.  Mildly tachycardic  Pulmonary/Chest: Effort normal. No respiratory distress.  Respirations equal and unlabored, patient able to speak in full sentences, lungs clear to auscultation bilaterally  Abdominal: Soft. Bowel sounds are normal. She exhibits no distension and no mass. There is no tenderness. There is no guarding.  Abdomen soft, nondistended, nontender to palpation in all quadrants without guarding or peritoneal signs, no CVA tenderness bilaterally  Genitourinary:  Genitourinary Comments: Chaperone present during pelvic exam. No external genital lesions noted. Moderate amount of dark blood present in the vaginal vault, but no vaginal discharge, no cervical friability.  On bimanual exam there is no cervical motion tenderness, no tenderness over the uterus and bilateral adnexa are without tenderness or masses.  Neurological: She is alert. Coordination normal.  Skin: She is not diaphoretic.  Psychiatric: She has a normal mood and affect. Her behavior is normal.  Nursing note and vitals  reviewed.    ED Treatments / Results  Labs (all labs ordered are listed, but only abnormal results are displayed) Labs Reviewed  COMPREHENSIVE METABOLIC PANEL - Abnormal; Notable for the following components:      Result Value   CO2 21 (*)    All other components within normal limits  CBC - Abnormal; Notable for the following components:   WBC 14.5 (*)    All other components within normal limits  LIPASE, BLOOD  URINALYSIS, ROUTINE W REFLEX MICROSCOPIC  I-STAT BETA HCG BLOOD, ED ([facility], [facility], [facility] ONLY)    EKG EKG Interpretation  Date/Time:  Monday April 10 2018 13:41:35 EDT Ventricular Rate:  114 PR Interval:    QRS Duration: 78 QT Interval:  454 QTC Calculation: 625 R Axis:   87 Text Interpretation:   Critical Test Result: Long QTc Sinus tachycardia Prolonged QT Abnormal ECG Confirmed by Margarita Grizzleay, Danielle [phone]([id number]) on 04/10/2018 8:16:43 PM Also confirmed by Margarita Grizzleay, Danielle [phone]([id number])   on 04/10/2018 8:17:03 PM   Radiology Dg Chest 2 View  Result Date: 04/10/2018 CLINICAL DATA:  Dizziness and weakness.  Urinary tract infection. EXAM: CHEST - 2 VIEW COMPARISON:  None. FINDINGS: The heart size and mediastinal contours are within normal limits. Both lungs are clear. The visualized skeletal structures are unremarkable. IMPRESSION: No active cardiopulmonary disease. Electronically Signed   By: Elsie StainJohn T Curnes M.D.   On: 04/10/2018 17:09   Ct Angio Chest Pe W And/or Wo Contrast  Result Date: 04/11/2018 CLINICAL DATA:  Upper respiratory infection with malaise. Patient felt dizziness and dyspnea at work today. EXAM: CT ANGIOGRAPHY CHEST WITH CONTRAST TECHNIQUE: Multidetector CT imaging of the chest was performed using the standard protocol during bolus administration of intravenous contrast. Multiplanar CT image reconstructions and MIPs were obtained to evaluate the vascular anatomy. CONTRAST:  100mL ISOVUE-370 IOPAMIDOL (ISOVUE-370) INJECTION 76% COMPARISON:  Same day CXR FINDINGS: Cardiovascular: Normal heart size without pericardial effusion. Nonaneurysmal thoracic aorta without dissection. No acute pulmonary embolus. Satisfactory pulmonary opacification to the segmental and subsegmental level. Streak artifact simulating intraluminal defect in the distal right main pulmonary embolus is noted. Mediastinum/Nodes: Residual thymic tissue noted anteriorly within the superior mediastinum. No mediastinal adenopathy. Small reactive hilar lymph nodes are identified bilaterally, the largest on the right measuring 6 mm short axis. Unremarkable CT appearance of the esophagus. Lungs/Pleura: Pulmonary consolidation in the posterior basal segment of the right lower lobe consistent with pneumonia. No parapneumonic effusion. No pneumothorax or dominant mass. Upper Abdomen: No acute abnormality. Musculoskeletal: No chest wall abnormality. No acute or significant osseous findings. Stable sclerotic density in the T6  vertebral body likely representing a small bone island. Review of the MIP images confirms the above findings. IMPRESSION: 1. Pulmonary consolidation in the right lower lobe consistent with pneumonia. Small reactive lymph nodes in the hila. 2. No acute pulmonary embolus. Electronically Signed   By: Tollie Ethavid  Kwon M.D.   On: 04/11/2018 00:31    Procedures Procedures (including critical care time)  Medications Ordered in ED Medications  magnesium sulfate IVPB 1 g 25 mL (1 g Intravenous New Bag/Given 04/11/18 0112)  sodium chloride 0.9 % bolus 1,000 mL (0 mLs Intravenous Stopped 04/10/18 1932)  iopamidol (ISOVUE-370) 76 % injection 100 mL (100 mLs Intravenous Contrast Given 04/11/18 0002)     Initial Impression / Assessment and Plan / ED Course  I have reviewed the triage vital signs and the nursing notes.  Pertinent labs & imaging results that were available during my care of the patient  were reviewed by me and considered in my medical decision making (see chart for details).  Presents for evaluation after syncopal episode while at work today.  She was seated at her desk when she started to feel very nauseous, short of breath and lightheaded, and then "blacked out".  She did not fall or hit her head.  On arrival patient is mildly tachycardic at 110, vitals otherwise stable.  She was recently started on azithromycin for URI, and Macrobid for UTI.  Has had issues with increased frequency of urinary symptoms over the past month.   EKG is significant for a prolonged QTC of 625, review of patient's prior EKGs this is a new finding, sinus tachycardia without any other new or concerning changes.  Patient reports she has been diagnosed with PACs in the past but has never been told she had prolonged QT.  This in the setting of syncopal episode from a seated position is concerning.  She has not had any chest pain but felt a little bit short of breath before and after episode at work today.  Her troponin is  negative, she does have a white count of 14.5, which could be in respect to viral upper respiratory infection.  Chest x-ray shows no pneumonia or other acute cardiopulmonary disease.  There are no acute electrolyte derangements, normal mag, normal renal and liver function.  Normal lipase.  Urinalysis shows hemoglobin, and patient is currently on her menstrual cycle, there are no signs of infection on UA.  She denies pelvic pain or vaginal discharge.  Has had multiple UTIs, reports her cultures have not grown back anything.  No one has done a pelvic exam.  Pelvic exam performed here which shows a small amount of dark blood in the vaginal vault, but no discharge noted, no cervical motion tenderness or adnexal tenderness to suggest PID.  Wet prep came back with many WBCs, but no other findings, patient reports she is in a monogamous relationship with one partner, and does not think she is been exposed to an STD and would prefer to wait for these test to come back prior to receiving treatment.  Urgent care recommended she follow-up with a urologist if urinary symptoms persist.  Patient's d-dimer is mildly elevated at 0.61, will get CTA especially in the setting of patient's factor II deficiency.  Concern that QT prolongation could be related to patient's recent starting of azithromycin, and given that she had a syncopal episode today with this new EKG finding feel the patient will require observation overnight to ensure that QT improves, now that azithromycin has been stopped.  CTA shows no evidence of PE, shows small right lower lobe consolidation suggestive of pneumonia.  Case has been discussed with Dr. new with [facility] hospitalist who will see and admit the patient, will defer to admitting team regarding antibiotics.  Requests 1 g of mag to be given.  Final Clinical Impressions(s) / ED Diagnoses   Final diagnoses:  Syncope, unspecified syncope type  QT prolongation  Dysuria  Pneumonia of right lower lobe  due to infectious organism [facility])    ED Discharge Orders    None       Dartha Lodge, [place] 04/11/18 0115    Margarita Grizzle, MD 04/13/18 1104

## 2018-04-11 ENCOUNTER — Encounter (HOSPITAL_COMMUNITY): Payer: Self-pay | Admitting: Internal Medicine

## 2018-04-11 ENCOUNTER — Emergency Department (HOSPITAL_COMMUNITY): Payer: BLUE CROSS/BLUE SHIELD

## 2018-04-11 ENCOUNTER — Observation Stay (HOSPITAL_BASED_OUTPATIENT_CLINIC_OR_DEPARTMENT_OTHER): Payer: BLUE CROSS/BLUE SHIELD

## 2018-04-11 DIAGNOSIS — R7989 Other specified abnormal findings of blood chemistry: Secondary | ICD-10-CM | POA: Diagnosis not present

## 2018-04-11 DIAGNOSIS — J181 Lobar pneumonia, unspecified organism: Secondary | ICD-10-CM | POA: Diagnosis present

## 2018-04-11 DIAGNOSIS — F32A Depression, unspecified: Secondary | ICD-10-CM | POA: Diagnosis present

## 2018-04-11 DIAGNOSIS — A419 Sepsis, unspecified organism: Secondary | ICD-10-CM | POA: Diagnosis present

## 2018-04-11 DIAGNOSIS — K589 Irritable bowel syndrome without diarrhea: Secondary | ICD-10-CM | POA: Diagnosis present

## 2018-04-11 DIAGNOSIS — E282 Polycystic ovarian syndrome: Secondary | ICD-10-CM | POA: Diagnosis present

## 2018-04-11 DIAGNOSIS — E785 Hyperlipidemia, unspecified: Secondary | ICD-10-CM | POA: Diagnosis present

## 2018-04-11 DIAGNOSIS — R9431 Abnormal electrocardiogram [ECG] [EKG]: Secondary | ICD-10-CM | POA: Insufficient documentation

## 2018-04-11 DIAGNOSIS — R3129 Other microscopic hematuria: Secondary | ICD-10-CM | POA: Diagnosis not present

## 2018-04-11 DIAGNOSIS — N390 Urinary tract infection, site not specified: Secondary | ICD-10-CM | POA: Diagnosis present

## 2018-04-11 DIAGNOSIS — R55 Syncope and collapse: Secondary | ICD-10-CM | POA: Diagnosis present

## 2018-04-11 DIAGNOSIS — I951 Orthostatic hypotension: Secondary | ICD-10-CM

## 2018-04-11 DIAGNOSIS — D682 Hereditary deficiency of other clotting factors: Secondary | ICD-10-CM | POA: Diagnosis present

## 2018-04-11 DIAGNOSIS — F329 Major depressive disorder, single episode, unspecified: Secondary | ICD-10-CM | POA: Diagnosis present

## 2018-04-11 LAB — RESPIRATORY PANEL BY PCR
Adenovirus: NOT DETECTED
BORDETELLA PERTUSSIS-RVPCR: NOT DETECTED
CHLAMYDOPHILA PNEUMONIAE-RVPPCR: NOT DETECTED
CORONAVIRUS HKU1-RVPPCR: NOT DETECTED
Coronavirus 229E: NOT DETECTED
Coronavirus NL63: NOT DETECTED
Coronavirus OC43: NOT DETECTED
Influenza A: NOT DETECTED
Influenza B: NOT DETECTED
MYCOPLASMA PNEUMONIAE-RVPPCR: NOT DETECTED
Metapneumovirus: NOT DETECTED
PARAINFLUENZA VIRUS 3-RVPPCR: NOT DETECTED
Parainfluenza Virus 1: NOT DETECTED
Parainfluenza Virus 2: NOT DETECTED
Parainfluenza Virus 4: NOT DETECTED
RHINOVIRUS / ENTEROVIRUS - RVPPCR: NOT DETECTED
Respiratory Syncytial Virus: NOT DETECTED

## 2018-04-11 LAB — PROCALCITONIN

## 2018-04-11 LAB — GC/CHLAMYDIA PROBE AMP (~~LOC~~) NOT AT ARMC
CHLAMYDIA, DNA PROBE: NEGATIVE
Neisseria Gonorrhea: NEGATIVE

## 2018-04-11 LAB — URINE CULTURE: Culture: NO GROWTH

## 2018-04-11 LAB — LACTIC ACID, PLASMA
Lactic Acid, Venous: 0.9 mmol/L (ref 0.5–1.9)
Lactic Acid, Venous: 0.9 mmol/L (ref 0.5–1.9)

## 2018-04-11 LAB — HIV ANTIBODY (ROUTINE TESTING W REFLEX): HIV SCREEN 4TH GENERATION: NONREACTIVE

## 2018-04-11 LAB — HCG, QUANTITATIVE, PREGNANCY: hCG, Beta Chain, Quant, S: <1 m[IU]/mL (ref <5)

## 2018-04-11 LAB — RPR: RPR: NONREACTIVE

## 2018-04-11 LAB — STREP PNEUMONIAE URINARY ANTIGEN: STREP PNEUMO URINARY ANTIGEN: NEGATIVE

## 2018-04-11 MED ORDER — ACETAMINOPHEN 500 MG PO TABS
500.0000 mg | ORAL_TABLET | Freq: Four times a day (QID) | ORAL | Status: DC | PRN
  Administered 2018-04-11: 500 mg via ORAL
  Filled 2018-04-11: qty 1

## 2018-04-11 MED ORDER — DM-GUAIFENESIN ER 30-600 MG PO TB12: 1.0000 | ORAL_TABLET | Freq: Two times a day (BID) | ORAL | Status: DC | PRN

## 2018-04-11 MED ORDER — SODIUM CHLORIDE 0.9 % IV SOLN
1.0000 g | INTRAVENOUS | Status: DC
  Administered 2018-04-11 (×2): 1 g via INTRAVENOUS
  Filled 2018-04-11 (×2): qty 10

## 2018-04-11 MED ORDER — DICYCLOMINE HCL 10 MG PO CAPS
10.0000 mg | ORAL_CAPSULE | Freq: Three times a day (TID) | ORAL | Status: DC
  Administered 2018-04-11 (×4): 10 mg via ORAL
  Filled 2018-04-11 (×4): qty 1

## 2018-04-11 MED ORDER — SODIUM CHLORIDE 0.9 % IV SOLN
INTRAVENOUS | Status: DC | PRN
  Administered 2018-04-11: 1000 mL via INTRAVENOUS

## 2018-04-11 MED ORDER — RISAQUAD PO CAPS: ORAL_CAPSULE | Freq: Every day | ORAL | Status: DC

## 2018-04-11 MED ORDER — ZOLPIDEM TARTRATE 5 MG PO TABS: 5.0000 mg | ORAL_TABLET | Freq: Every evening | ORAL | Status: DC | PRN

## 2018-04-11 MED ORDER — ROSUVASTATIN CALCIUM 20 MG PO TABS
20.0000 mg | ORAL_TABLET | Freq: Every day | ORAL | Status: DC
  Administered 2018-04-11 (×2): 20 mg via ORAL
  Filled 2018-04-11 (×2): qty 1

## 2018-04-11 MED ORDER — DOXYCYCLINE HYCLATE 100 MG PO TABS
100.0000 mg | ORAL_TABLET | Freq: Two times a day (BID) | ORAL | Status: DC
  Administered 2018-04-11 – 2018-04-12 (×4): 100 mg via ORAL
  Filled 2018-04-11 (×4): qty 1

## 2018-04-11 MED ORDER — MAGNESIUM SULFATE 2 GM/50ML IV SOLN
1.0000 g | Freq: Once | INTRAVENOUS | Status: AC
  Administered 2018-04-11: 1 g via INTRAVENOUS
  Filled 2018-04-11: qty 50

## 2018-04-11 MED ORDER — ALPRAZOLAM 0.25 MG PO TABS: 0.2500 mg | ORAL_TABLET | Freq: Three times a day (TID) | ORAL | Status: DC | PRN

## 2018-04-11 MED ORDER — VITAMIN C 500 MG PO TABS
250.0000 mg | ORAL_TABLET | Freq: Every day | ORAL | Status: DC
  Administered 2018-04-11: 250 mg via ORAL
  Filled 2018-04-11 (×2): qty 1

## 2018-04-11 MED ORDER — SODIUM CHLORIDE 0.9 % IV BOLUS
1500.0000 mL | Freq: Once | INTRAVENOUS | Status: AC
  Administered 2018-04-11: 1500 mL via INTRAVENOUS

## 2018-04-11 MED ORDER — IPRATROPIUM BROMIDE 0.02 % IN SOLN: 0.5000 mg | RESPIRATORY_TRACT | Status: DC | PRN

## 2018-04-11 MED ORDER — ALUM & MAG HYDROXIDE-SIMETH 200-200-20 MG/5ML PO SUSP: 30.0000 mL | Freq: Four times a day (QID) | ORAL | Status: DC | PRN

## 2018-04-11 MED ORDER — ASPIRIN EC 81 MG PO TBEC
81.0000 mg | DELAYED_RELEASE_TABLET | Freq: Every day | ORAL | Status: DC
  Administered 2018-04-11 (×2): 81 mg via ORAL
  Filled 2018-04-11 (×3): qty 1

## 2018-04-11 MED ORDER — LORATADINE 10 MG PO TABS
10.0000 mg | ORAL_TABLET | Freq: Every day | ORAL | Status: DC
  Administered 2018-04-11 – 2018-04-12 (×2): 10 mg via ORAL
  Filled 2018-04-11 (×2): qty 1

## 2018-04-11 MED ORDER — RISAQUAD PO CAPS
1.0000 | ORAL_CAPSULE | Freq: Every day | ORAL | Status: DC
  Administered 2018-04-11: 1 via ORAL
  Filled 2018-04-11: qty 1

## 2018-04-11 MED ORDER — ADULT MULTIVITAMIN W/MINERALS CH: 1.0000 | ORAL_TABLET | Freq: Every day | ORAL | Status: DC

## 2018-04-11 MED ORDER — FAMOTIDINE 20 MG PO TABS
20.0000 mg | ORAL_TABLET | Freq: Once | ORAL | Status: AC
  Administered 2018-04-11: 20 mg via ORAL
  Filled 2018-04-11: qty 1

## 2018-04-11 MED ORDER — ENOXAPARIN SODIUM 40 MG/0.4ML ~~LOC~~ SOLN
40.0000 mg | SUBCUTANEOUS | Status: DC
  Administered 2018-04-11: 40 mg via SUBCUTANEOUS
  Filled 2018-04-11: qty 0.4

## 2018-04-11 MED ORDER — HYDROXYZINE HCL 50 MG/ML IM SOLN
25.0000 mg | Freq: Four times a day (QID) | INTRAMUSCULAR | Status: DC | PRN
  Administered 2018-04-11 (×2): 25 mg via INTRAMUSCULAR
  Filled 2018-04-11 (×7): qty 0.5

## 2018-04-11 MED ORDER — NORETHINDRONE 0.35 MG PO TABS: 1.0000 | ORAL_TABLET | Freq: Every day | ORAL | Status: DC

## 2018-04-11 MED ORDER — SODIUM CHLORIDE 0.9 % IV SOLN
INTRAVENOUS | Status: DC
  Administered 2018-04-11 (×3): via INTRAVENOUS

## 2018-04-11 MED ORDER — ADULT MULTIVITAMIN W/MINERALS CH
1.0000 | ORAL_TABLET | Freq: Every day | ORAL | Status: DC
  Administered 2018-04-11: 1 via ORAL
  Filled 2018-04-11 (×2): qty 1

## 2018-04-11 MED ORDER — ALUM & MAG HYDROXIDE-SIMETH 200-200-20 MG/5ML PO SUSP
30.0000 mL | Freq: Once | ORAL | Status: AC
  Administered 2018-04-11: 30 mL via ORAL
  Filled 2018-04-11: qty 30

## 2018-04-11 NOTE — Plan of Care (Signed)
  Problem: Education: Goal: Knowledge of General Education information will improve Description Including pain rating scale, medication(s)/side effects and non-pharmacologic comfort measures 04/11/2018 0319 by Orvilla Fus, RN Outcome: Progressing 04/11/2018 0239 by Orvilla Fus, RN Outcome: Not Met (add Reason)   Problem: Health Behavior/Discharge Planning: Goal: Ability to manage health-related needs will improve 04/11/2018 0319 by Orvilla Fus, RN Outcome: Progressing 04/11/2018 0239 by Orvilla Fus, RN Outcome: Not Met (add Reason)   Problem: Clinical Measurements: Goal: Respiratory complications will improve 04/11/2018 0319 by Orvilla Fus, RN Outcome: Progressing 04/11/2018 0239 by Orvilla Fus, RN Outcome: Not Met (add Reason) Goal: Cardiovascular complication will be avoided 04/11/2018 0319 by Orvilla Fus, RN Outcome: Progressing 04/11/2018 0239 by Orvilla Fus, RN Outcome: Not Met (add Reason)   Problem: Activity: Goal: Risk for activity intolerance will decrease 04/11/2018 0319 by Orvilla Fus, RN Outcome: Progressing 04/11/2018 0239 by Orvilla Fus, RN Outcome: Not Met (add Reason)   Problem: Nutrition: Goal: Adequate nutrition will be maintained 04/11/2018 0319 by Orvilla Fus, RN Outcome: Progressing 04/11/2018 0239 by Orvilla Fus, RN Outcome: Not Met (add Reason)   Problem: Coping: Goal: Level of anxiety will decrease 04/11/2018 0319 by Orvilla Fus, RN Outcome: Progressing 04/11/2018 0239 by Orvilla Fus, RN Outcome: Not Met (add Reason)   Problem: Elimination: Goal: Will not experience complications related to bowel motility 04/11/2018 0319 by Orvilla Fus, RN Outcome: Progressing 04/11/2018 0239 by Orvilla Fus, RN Outcome: Not Met (add Reason) Goal: Will not experience complications related to urinary retention 04/11/2018 0319 by Orvilla Fus, RN Outcome: Progressing 04/11/2018 0239 by  Orvilla Fus, RN Outcome: Not Met (add Reason)   Problem: Pain Managment: Goal: General experience of comfort will improve 04/11/2018 0319 by Orvilla Fus, RN Outcome: Progressing 04/11/2018 0239 by Orvilla Fus, RN Outcome: Not Met (add Reason)   Problem: Safety: Goal: Ability to remain free from injury will improve 04/11/2018 0319 by Orvilla Fus, RN Outcome: Progressing 04/11/2018 0239 by Orvilla Fus, RN Outcome: Not Met (add Reason)   Problem: Skin Integrity: Goal: Risk for impaired skin integrity will decrease 04/11/2018 0319 by Orvilla Fus, RN Outcome: Progressing 04/11/2018 0239 by Orvilla Fus, RN Outcome: Not Met (add Reason)

## 2018-04-11 NOTE — Progress Notes (Signed)
PROGRESS NOTE                                                                                                                                                                                                             Patient Demographics:    Bonnie Adkins, is a 22 y.o. female, DOB - 1996/05/18, ZOX:[id number]  Admit date - 04/10/2018   Admitting Physician Lorretta Harp, MD  Outpatient Primary MD for the patient is Eartha Inch, MD  LOS - 0  Outpatient Specialists: none  Chief Complaint  Patient presents with  . Shortness of Breath  . Anxiety       Brief Narrative   22 year old female with hyperlipidemia, PCOS, IBS, depression presenting with cough, shortness of breath, watery diarrhea and near syncopal episode. In the ED she was mildly tachycardic with low-grade fever, CBC of 14.5K with elevated d-dimer.  CT angiogram negative for PE but showed right lower lobe consolidation suggestive of pneumonia.    Subjective:   Patient reports some cough but no further dizziness shortness of breath is better.  Feels tired.   Assessment  & Plan :    Principal Problem: SIRS with lobar pneumonia (HCC) Symptoms improving.  Continue IV Rocephin and doxycycline.  Supportive care with PRN nebs and antitussives.  Follow culture.  Continue maintenance IV fluid.  Active Problems: Near syncope with orthostatic hypotension Received IV fluid bolus in the ED.  Continue maintenance fluid.  Recheck orthostasis in a.m.  Prolonged QTC Holding QT prolonging agents (Zoloft).  Repeat EKG normal.  DC telemetry.  PCOS On metformin  Irritable bowel syndrome Continue home meds  Elevated d-dimer CT angiogram of the chest negative for PE.  Lower extremity Doppler ordered on admission.   Code Status : Full code  Family Communication  : Family at bedside  Disposition Plan  : Home possibly tomorrow if symptoms improved and cultures  negative  Barriers For Discharge : Active symptoms  Consults  : None  Procedures  : None  DVT Prophylaxis  :  Lovenox -   Lab Results  Component Value Date   PLT 300 04/10/2018    Antibiotics  :    Anti-infectives (From admission, onward)   Start     Dose/Rate Route Frequency Ordered Stop   04/11/18 0130  cefTRIAXone (ROCEPHIN) 1 g in sodium chloride 0.9 %  100 mL IVPB     1 g 200 mL/hr over 30 Minutes Intravenous Every 24 hours 04/11/18 0055 04/17/18 2159   04/11/18 0130  doxycycline (VIBRA-TABS) tablet 100 mg     100 mg Oral Every 12 hours 04/11/18 0055 04/17/18 2159        Objective:   Vitals:   04/10/18 1930 04/10/18 2030 04/11/18 0107 04/11/18 0158  BP: 111/89 117/78 105/62 106/74  Pulse: (!) 104 99 99 92  Resp: 17 20 18 20   Temp:    99.5 F (37.5 C)  TempSrc:    Oral  SpO2: 99% 100% 99% 98%  Weight:    59.9 kg  Height:    5\' 3"  (1.6 m)    Wt Readings from Last 3 Encounters:  04/11/18 59.9 kg  12/07/16 50.3 kg  10/24/16 52.2 kg     Intake/Output Summary (Last 24 hours) at 04/11/2018 1028 Last data filed at 04/11/2018 0630 Gross per 24 hour  Intake 1979.36 ml  Output 400 ml  Net 1579.36 ml     Physical Exam  Gen: not in distress HEENT: no pallor, dry oral mucosa, supple neck Chest: clear b/l, no added sounds CVS: N S1&S2, no murmurs, GI: soft, NT, ND, BS+ Musculoskeletal: warm, no edema     Data Review:    CBC Recent Labs  Lab 04/10/18 1404  WBC 14.5*  HGB 13.8  HCT 43.3  PLT 300  MCV 88.5  MCH 28.2  MCHC 31.9  RDW 12.3    Chemistries  Recent Labs  Lab 04/10/18 1404 04/10/18 1807  NA 142  --   K 4.1  --   CL 108  --   CO2 21*  --   GLUCOSE 82  --   BUN 7  --   CREATININE 0.55  --   CALCIUM 9.6  --   MG  --  1.9  AST 32  --   ALT 33  --   ALKPHOS 41  --   BILITOT 0.8  --    ------------------------------------------------------------------------------------------------------------------ No results for input(s):  CHOL, HDL, LDLCALC, TRIG, CHOLHDL, LDLDIRECT in the last 72 hours.  Lab Results  Component Value Date   HGBA1C 5.0 10/25/2016   ------------------------------------------------------------------------------------------------------------------ No results for input(s): TSH, T4TOTAL, T3FREE, THYROIDAB in the last 72 hours.  Invalid input(s): FREET3 ------------------------------------------------------------------------------------------------------------------ No results for input(s): VITAMINB12, FOLATE, FERRITIN, TIBC, IRON, RETICCTPCT in the last 72 hours.  Coagulation profile No results for input(s): INR, PROTIME in the last 168 hours.  Recent Labs    04/10/18 1550  DDIMER 0.61*    Cardiac Enzymes No results for input(s): CKMB, TROPONINI, MYOGLOBIN in the last 168 hours.  Invalid input(s): CK ------------------------------------------------------------------------------------------------------------------ No results found for: BNP  Inpatient Medications  Scheduled Meds: . acidophilus  1 capsule Oral QHS  . aspirin EC  81 mg Oral QHS  . dicyclomine  10 mg Oral TID AC & HS  . doxycycline  100 mg Oral Q12H  . loratadine  10 mg Oral Daily  . multivitamin with minerals  1 tablet Oral QHS  . norethindrone  1 tablet Oral QHS  . rosuvastatin  20 mg Oral QHS  . vitamin C  250 mg Oral QHS   Continuous Infusions: . sodium chloride 125 mL/hr at 04/11/18 0721  . sodium chloride 10 mL/hr at 04/11/18 0516  . cefTRIAXone (ROCEPHIN)  IV Stopped (04/11/18 0437)   PRN Meds:.sodium chloride, acetaminophen, ALPRAZolam, dextromethorphan-guaiFENesin, hydrOXYzine, ipratropium, zolpidem  Micro Results Recent Results (from the past  240 hour(s))  Wet prep, genital     Status: Abnormal   Collection Time: 04/10/18  6:32 PM  Result Value Ref Range Status   Yeast Wet Prep HPF POC NONE SEEN NONE SEEN Final   Trich, Wet Prep NONE SEEN NONE SEEN Final   Clue Cells Wet Prep HPF POC NONE SEEN  NONE SEEN Final   WBC, Wet Prep HPF POC MANY (A) NONE SEEN Final   Sperm NONE SEEN  Final    Comment: Performed at [facility], [address], [place], [place] [address]  Respiratory Panel by PCR     Status: None   Collection Time: 04/11/18  2:30 AM  Result Value Ref Range Status   Adenovirus NOT DETECTED NOT DETECTED Final   Coronavirus 229E NOT DETECTED NOT DETECTED Final   Coronavirus HKU1 NOT DETECTED NOT DETECTED Final   Coronavirus NL63 NOT DETECTED NOT DETECTED Final   Coronavirus OC43 NOT DETECTED NOT DETECTED Final   Metapneumovirus NOT DETECTED NOT DETECTED Final   Rhinovirus / Enterovirus NOT DETECTED NOT DETECTED Final   Influenza A NOT DETECTED NOT DETECTED Final   Influenza B NOT DETECTED NOT DETECTED Final   Parainfluenza Virus 1 NOT DETECTED NOT DETECTED Final   Parainfluenza Virus 2 NOT DETECTED NOT DETECTED Final   Parainfluenza Virus 3 NOT DETECTED NOT DETECTED Final   Parainfluenza Virus 4 NOT DETECTED NOT DETECTED Final   Respiratory Syncytial Virus NOT DETECTED NOT DETECTED Final   Bordetella pertussis NOT DETECTED NOT DETECTED Final   Chlamydophila pneumoniae NOT DETECTED NOT DETECTED Final   Mycoplasma pneumoniae NOT DETECTED NOT DETECTED Final    Comment: Performed at [facility], [address], [place], [place] [address]    Radiology Reports Dg Chest 2 View  Result Date: 04/10/2018 CLINICAL DATA:  Dizziness and weakness.  Urinary tract infection. EXAM: CHEST - 2 VIEW COMPARISON:  None. FINDINGS: The heart size and mediastinal contours are within normal limits. Both lungs are clear. The visualized skeletal structures are unremarkable. IMPRESSION: No active cardiopulmonary disease. Electronically Signed   By: Elsie Stain M.D.   On: 04/10/2018 17:09   Ct Angio Chest Pe W And/or Wo Contrast  Result Date: 04/11/2018 CLINICAL DATA:  Upper respiratory infection with malaise. Patient felt dizziness and dyspnea at work today. EXAM: CT ANGIOGRAPHY  CHEST WITH CONTRAST TECHNIQUE: Multidetector CT imaging of the chest was performed using the standard protocol during bolus administration of intravenous contrast. Multiplanar CT image reconstructions and MIPs were obtained to evaluate the vascular anatomy. CONTRAST:  ISOVUE-370 IOPAMIDOL (ISOVUE-370) INJECTION 76% COMPARISON:  Same day CXR FINDINGS: Cardiovascular: Normal heart size without pericardial effusion. Nonaneurysmal thoracic aorta without dissection. No acute pulmonary embolus. Satisfactory pulmonary opacification to the segmental and subsegmental level. Streak artifact simulating intraluminal defect in the distal right main pulmonary embolus is noted. Mediastinum/Nodes: Residual thymic tissue noted anteriorly within the superior mediastinum. No mediastinal adenopathy. Small reactive hilar lymph nodes are identified bilaterally, the largest on the right measuring 6 mm short axis. Unremarkable CT appearance of the esophagus. Lungs/Pleura: Pulmonary consolidation in the posterior basal segment of the right lower lobe consistent with pneumonia. No parapneumonic effusion. No pneumothorax or dominant mass. Upper Abdomen: No acute abnormality. Musculoskeletal: No chest wall abnormality. No acute or significant osseous findings. Stable sclerotic density in the T6 vertebral body likely representing a small bone island. Review of the MIP images confirms the above findings. IMPRESSION: 1. Pulmonary consolidation in the right lower lobe consistent  with pneumonia. Small reactive lymph nodes in the hila. 2. No acute pulmonary embolus. Electronically Signed   By: Tollie Eth M.D.   On: 04/11/2018 00:31    Time Spent in minutes  25   Brianni Manthe M.D on 04/11/2018 at 10:28 AM  Between 7am to 7pm - Pager - [phone]  After 7pm go to [email or web address] - password [email or web address]  [facility] -  Office  [phone]

## 2018-04-11 NOTE — ED Notes (Signed)
Pt stated she got really dizzy when standing while obtaining orthostatic vital signs, RN Koleen Nimroddrian made aware.

## 2018-04-11 NOTE — Progress Notes (Signed)
LE venous duplex prelim: negative for DVT. Gwen Sarvis Eunice, RDMS, RVT  

## 2018-04-12 DIAGNOSIS — J181 Lobar pneumonia, unspecified organism: Secondary | ICD-10-CM | POA: Diagnosis not present

## 2018-04-12 DIAGNOSIS — E785 Hyperlipidemia, unspecified: Secondary | ICD-10-CM

## 2018-04-12 DIAGNOSIS — R55 Syncope and collapse: Secondary | ICD-10-CM

## 2018-04-12 DIAGNOSIS — D682 Hereditary deficiency of other clotting factors: Secondary | ICD-10-CM

## 2018-04-12 DIAGNOSIS — E282 Polycystic ovarian syndrome: Secondary | ICD-10-CM

## 2018-04-12 DIAGNOSIS — K589 Irritable bowel syndrome without diarrhea: Secondary | ICD-10-CM

## 2018-04-12 LAB — LEGIONELLA PNEUMOPHILA SEROGP 1 UR AG: L. pneumophila Serogp 1 Ur Ag: NEGATIVE

## 2018-04-12 MED ORDER — AMOXICILLIN-POT CLAVULANATE 875-125 MG PO TABS: 1.0000 | ORAL_TABLET | Freq: Two times a day (BID) | ORAL | 0 refills | Status: AC

## 2018-04-12 MED ORDER — ASPIRIN 81 MG PO TBEC: 81.0000 mg | DELAYED_RELEASE_TABLET | Freq: Every day | ORAL | Status: DC

## 2018-04-12 MED ORDER — METFORMIN HCL ER 500 MG PO TB24: 500.0000 mg | ORAL_TABLET | Freq: Two times a day (BID) | ORAL | Status: AC

## 2018-04-12 NOTE — Discharge Summary (Addendum)
Physician Discharge Summary  [facility] WUJ:[id number] DOB: [birth year]  PCP: Eartha Inch, MD  Admit date: 04/10/2018 Discharge date: 04/12/2018  Recommendations for Outpatient Follow-up:  1. Dr. Antony Haste, PCP in 1 week with repeat labs (CBC & BMP).  Consider repeating EKG to reassess QTC during that visit. 2. Recommend repeating urine microscopy in a couple of weeks and consider further evaluation if she has persistent microscopic hematuria. 3. Dr. Jacinto Halim, Cardiology  Home Health: None Equipment/Devices: None  Discharge Condition: Improved and stable CODE STATUS: Full Diet recommendation: Regular diet  Discharge Diagnoses:  Principal Problem:   Lobar pneumonia (HCC) Active Problems:   UTI (urinary tract infection)   HLD (hyperlipidemia)   Depression   Factor II deficiency (HCC)   Near syncope   Prolonged QT interval   Sepsis (HCC)   PCOS (polycystic ovarian syndrome)   IBS (irritable bowel syndrome)   Brief Summary: 22 year old married female, works as a Dentist at [employer], PMH of PCOS on metformin, HLD, IBS, depression, 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 Jacinto Halim, has not had VTE's in the past, presented to ED with cough, dyspnea, watery diarrhea and a near syncopal episode.  In the ED she was mildly tachycardic with low-grade fever, leukocytosis of 14.5K, elevated d-dimer.  CTA chest was negative for PE but showed right lower lobe consolidation concerning for pneumonia.  She was admitted for further evaluation and management.  Assessment and plan:  Principal Problem: SIRS with lobar pneumonia (HCC) Prior to admission, patient had taken azithromycin 500 mg x 1.  On admission patient was placed on IV ceftriaxone and oral doxycycline.  She has completed approximately 2 days of inpatient antibiotics.  Her admission chest x-ray was negative for pneumonia but CTA chest revealed right lower lobe consolidation  consistent with pneumonia.  Lactate normal, HIV screen nonreactive, RPR nonreactive, urine pneumococcal antigen negative, beta hCG negative panel negative and blood cultures x2: Negative to date.  Clinically improved.  Patient transitioned to oral Augmentin at discharge to complete an additional 5 days.  Since chest x-ray did not show initial pneumonia, not sure if follow-up chest x-ray in 4 weeks is warranted.   Active Problems: Near syncope with orthostatic hypotension She was treated with IV fluid bolus in ED followed by IV maintenance fluid on the floor.  Orthostatic hypotension resolved.  Patient ambulated the floor without symptoms.  Prolonged QTC Azithromycin and Zoloft were held.  Repeat EKG showed resolution of prolonged QTC.  Zoloft resumed at discharge.  Avans consider follow-up EKG during outpatient PCP follow-up.  QTC on admission EKG 9/16: 625 ms.  Follow-up EKG 9/17: QTC 444 ms.  Patient reports that she had been on Zoloft for more than a year for anxiety and then came off for some time and resumed again this spring.  I counseled her to follow-up with her PCP with a repeat EKG.  She verbalized understanding.  PCOS On metformin, which was temporarily held in the hospital.  Advised to resume 48 hours post contrast she received for CTA chest.  Irritable bowel syndrome Continue home meds  Elevated d-dimer CT angiogram of the chest negative for PE.  Lower extremity Doppler negative for DVT.  Hyperlipidemia Continue statins.  Factor II deficiency: As per patient, she follows up with outpatient Cardiology and takes aspirin 81 mg daily.  Recent presumed UTI Had completed 2 days of nitrofurantoin PTA.  Urine culture done in the hospital negative.  No urinary  symptoms.  Nitrofurantoin discontinued.  Microscopic hematuria Not sure if she had recent menstruation.  Recommend follow-up urine microscopy in a couple of weeks when she is not actively menstruating and consider further  evaluation if has persistent microscopic hematuria.  Consultations:  None  Procedures:  Bilateral lower extremity venous Dopplers 04/11/2018:  Final Interpretation: Right: There is no evidence of deep vein thrombosis in the lower extremity. No cystic structure found in the popliteal fossa. Left: There is no evidence of deep vein thrombosis in the lower extremity. No cystic structure found in the popliteal fossa.  Discharge Instructions  Discharge Instructions    Call MD for:   Complete by:  As directed    Feeling like passing out.   Call MD for:  difficulty breathing, headache or visual disturbances   Complete by:  As directed    Call MD for:  extreme fatigue   Complete by:  As directed    Call MD for:  persistant dizziness or light-headedness   Complete by:  As directed    Call MD for:  persistant nausea and vomiting   Complete by:  As directed    Call MD for:  severe uncontrolled pain   Complete by:  As directed    Call MD for:  temperature >100.4   Complete by:  As directed    Diet general   Complete by:  As directed    Increase activity slowly   Complete by:  As directed        Medication List    STOP taking these medications   azithromycin 250 MG tablet Commonly known as:  ZITHROMAX   nitrofurantoin (macrocrystal-monohydrate) 100 MG capsule Commonly known as:  MACROBID     TAKE these medications   acetaminophen 500 MG tablet Commonly known as:  TYLENOL Take 500 mg by mouth every 6 (six) hours as needed for mild pain. For pain   amoxicillin-clavulanate 875-125 MG tablet Commonly known as:  AUGMENTIN Take 1 tablet by mouth 2 (two) times daily for 5 days.   aspirin 81 MG EC tablet Take 1 tablet (81 mg total) by mouth at bedtime.   benzonatate 100 MG capsule Commonly known as:  TESSALON Take 100 mg by mouth 3 (three) times daily as needed for cough.   cetirizine 10 MG tablet Commonly known as:  ZYRTEC Take 10 mg by mouth at bedtime.   DEBLITANE  0.35 MG tablet Generic drug:  norethindrone Take 1 tablet by mouth at bedtime.   metFORMIN 500 MG 24 hr tablet Commonly known as:  GLUCOPHAGE-XR Take 1 tablet (500 mg total) by mouth 2 (two) times daily. This was held in the hospital.  Restart taking it from 04/13/2018. What changed:  additional instructions   multivitamin tablet Take 1 tablet by mouth at bedtime.   PROBIOTIC PO Take 1 tablet by mouth at bedtime.   rosuvastatin 20 MG tablet Commonly known as:  CRESTOR Take 20 mg by mouth at bedtime.   sertraline 50 MG tablet Commonly known as:  ZOLOFT Take 50 mg by mouth at bedtime.   vitamin C 100 MG tablet Take 100 mg by mouth at bedtime.      Follow-up Information    [facility] EMERGENCY DEPARTMENT.   Specialty:  Emergency Medicine Why:  Return to ED with new or worsening symptoms or concerns Contact information: [address] [id number] [place] [address] [phone]       Eartha Inch, MD. Schedule an appointment as  soon as possible for a visit in 1 week(s).   Specialty:  Family Medicine Why:  To be seen with repeat labs (CBC & BMP). Contact information: [address] [place] [address] [phone]          Allergies  Allergen Reactions  . Pseudoephedrine Hcl Palpitations      Procedures/Studies: Dg Chest 2 View  Result Date: 04/10/2018 CLINICAL DATA:  Dizziness and weakness.  Urinary tract infection. EXAM: CHEST - 2 VIEW COMPARISON:  None. FINDINGS: The heart size and mediastinal contours are within normal limits. Both lungs are clear. The visualized skeletal structures are unremarkable. IMPRESSION: No active cardiopulmonary disease. Electronically Signed   By: Elsie Stain M.D.   On: 04/10/2018 17:09   Ct Angio Chest Pe W And/or Wo Contrast  Result Date: 04/11/2018 CLINICAL DATA:  Upper respiratory infection with malaise. Patient felt dizziness and dyspnea at work today. EXAM: CT  ANGIOGRAPHY CHEST WITH CONTRAST TECHNIQUE: Multidetector CT imaging of the chest was performed using the standard protocol during bolus administration of intravenous contrast. Multiplanar CT image reconstructions and MIPs were obtained to evaluate the vascular anatomy. CONTRAST:  ISOVUE-370 IOPAMIDOL (ISOVUE-370) INJECTION 76% COMPARISON:  Same day CXR FINDINGS: Cardiovascular: Normal heart size without pericardial effusion. Nonaneurysmal thoracic aorta without dissection. No acute pulmonary embolus. Satisfactory pulmonary opacification to the segmental and subsegmental level. Streak artifact simulating intraluminal defect in the distal right main pulmonary embolus is noted. Mediastinum/Nodes: Residual thymic tissue noted anteriorly within the superior mediastinum. No mediastinal adenopathy. Small reactive hilar lymph nodes are identified bilaterally, the largest on the right measuring 6 mm short axis. Unremarkable CT appearance of the esophagus. Lungs/Pleura: Pulmonary consolidation in the posterior basal segment of the right lower lobe consistent with pneumonia. No parapneumonic effusion. No pneumothorax or dominant mass. Upper Abdomen: No acute abnormality. Musculoskeletal: No chest wall abnormality. No acute or significant osseous findings. Stable sclerotic density in the T6 vertebral body likely representing a small bone island. Review of the MIP images confirms the above findings. IMPRESSION: 1. Pulmonary consolidation in the right lower lobe consistent with pneumonia. Small reactive lymph nodes in the hila. 2. No acute pulmonary embolus. Electronically Signed   By: Tollie Eth M.D.   On: 04/11/2018 00:31      Subjective: "I feel fine".  Denies complaints.  No cough, dyspnea, chest pain, dizziness or lightheadedness reported.  States that she never passed out.  Discharge Exam:  Vitals:   04/12/18 1009 04/12/18 1010 04/12/18 1013 04/12/18 1233  BP: 108/83 108/78 113/83 95/70  Pulse: 81 79 82  72  Resp: 20 20 20 16   Temp:    98.2 F (36.8 C)  TempSrc:    Oral  SpO2:    100%  Weight:      Height:        General: Pleasant young female, well-built and nourished, sitting up comfortably in bed without distress.. Cardiovascular: S1 & S2 heard, RRR. No murmurs, rubs, gallops or clicks. No JVD or pedal edema. Respiratory: Clear to auscultation without wheezing, rhonchi or crackles. No increased work of breathing. Abdominal:  Non distended, non tender & soft. No organomegaly or masses appreciated. Normal bowel sounds heard. CNS: Alert and oriented. No focal deficits. Extremities: no edema, no cyanosis    The results of significant diagnostics from this hospitalization (including imaging, microbiology, ancillary and laboratory) are listed below for reference.     Microbiology: Recent Results (from the past 240 hour(s))  Urine culture  Status: None   Collection Time: 04/10/18  4:32 PM  Result Value Ref Range Status   Specimen Description URINE, CLEAN CATCH  Final   Special Requests NONE  Final   Culture   Final    NO GROWTH Performed at [facility], [address] [place], [place] [address]    Report Status 04/11/2018 FINAL  Final  Wet prep, genital     Status: Abnormal   Collection Time: 04/10/18  6:32 PM  Result Value Ref Range Status   Yeast Wet Prep HPF POC NONE SEEN NONE SEEN Final   Trich, Wet Prep NONE SEEN NONE SEEN Final   Clue Cells Wet Prep HPF POC NONE SEEN NONE SEEN Final   WBC, Wet Prep HPF POC MANY (A) NONE SEEN Final   Sperm NONE SEEN  Final    Comment: Performed at [facility], [address] [place], [place] [address]  Culture, blood (x 2)     Status: None (Preliminary result)   Collection Time: 04/11/18  1:54 AM  Result Value Ref Range Status   Specimen Description BLOOD LEFT ARM  Final   Special Requests   Final    BOTTLES DRAWN AEROBIC ONLY Blood Culture results Vecchione not be optimal due to an inadequate volume of blood received in  culture bottles   Culture   Final    NO GROWTH 1 DAY Performed at [facility], [address] [place], [place] [address]    Report Status PENDING  Incomplete  Respiratory Panel by PCR     Status: None   Collection Time: 04/11/18  2:30 AM  Result Value Ref Range Status   Adenovirus NOT DETECTED NOT DETECTED Final   Coronavirus 229E NOT DETECTED NOT DETECTED Final   Coronavirus HKU1 NOT DETECTED NOT DETECTED Final   Coronavirus NL63 NOT DETECTED NOT DETECTED Final   Coronavirus OC43 NOT DETECTED NOT DETECTED Final   Metapneumovirus NOT DETECTED NOT DETECTED Final   Rhinovirus / Enterovirus NOT DETECTED NOT DETECTED Final   Influenza A NOT DETECTED NOT DETECTED Final   Influenza B NOT DETECTED NOT DETECTED Final   Parainfluenza Virus 1 NOT DETECTED NOT DETECTED Final   Parainfluenza Virus 2 NOT DETECTED NOT DETECTED Final   Parainfluenza Virus 3 NOT DETECTED NOT DETECTED Final   Parainfluenza Virus 4 NOT DETECTED NOT DETECTED Final   Respiratory Syncytial Virus NOT DETECTED NOT DETECTED Final   Bordetella pertussis NOT DETECTED NOT DETECTED Final   Chlamydophila pneumoniae NOT DETECTED NOT DETECTED Final   Mycoplasma pneumoniae NOT DETECTED NOT DETECTED Final    Comment: Performed at [facility], [address] [place], [place] [address]  Culture, blood (x 2)     Status: None (Preliminary result)   Collection Time: 04/11/18  4:03 AM  Result Value Ref Range Status   Specimen Description BLOOD LEFT HAND  Final   Special Requests   Final    BOTTLES DRAWN AEROBIC ONLY Blood Culture adequate volume   Culture   Final    NO GROWTH 1 DAY Performed at [facility], [address] [place], [place] [address]    Report Status PENDING  Incomplete     Labs: CBC: Recent Labs  Lab 04/10/18 1404  WBC 14.5*  HGB 13.8  HCT 43.3  MCV 88.5  PLT 300   Basic Metabolic Panel: Recent Labs  Lab 04/10/18 1404 04/10/18 1807  NA 142  --   K 4.1  --  CL 108  --   CO2  21*  --   GLUCOSE 82  --   BUN 7  --   CREATININE 0.55  --   CALCIUM 9.6  --   MG  --  1.9   Liver Function Tests: Recent Labs  Lab 04/10/18 1404  AST 32  ALT 33  ALKPHOS 41  BILITOT 0.8  PROT 7.7  ALBUMIN 4.7   Urinalysis    Component Value Date/Time   COLORURINE YELLOW 04/10/2018 1353   APPEARANCEUR CLEAR 04/10/2018 1353   LABSPEC 1.009 04/10/2018 1353   PHURINE 6.0 04/10/2018 1353   GLUCOSEU NEGATIVE 04/10/2018 1353   HGBUR LARGE (A) 04/10/2018 1353   BILIRUBINUR NEGATIVE 04/10/2018 1353   KETONESUR 20 (A) 04/10/2018 1353   PROTEINUR NEGATIVE 04/10/2018 1353   UROBILINOGEN 0.2 12/07/2013 1407   NITRITE NEGATIVE 04/10/2018 1353   LEUKOCYTESUR NEGATIVE 04/10/2018 1353    I discussed in detail with patient's spouse at bedside.  Updated care and answered questions.  Time coordinating discharge: 35 minutes  SIGNED:  Marcellus Scott, MD, FACP, [facility]. [facility] Pager [phone]  If 7PM-7AM, please contact night-coverage [email or web address] Password [facility] 04/12/2018, 12:53 PM

## 2018-04-12 NOTE — Progress Notes (Signed)
Discharge instructions reviewed with the patient and family to include medications, Prescriptions, Activity and follow up visits.  All voice understanding to teaching. Ambulatory to door.  Home via POV with her mother driving.

## 2018-04-12 NOTE — Discharge Instructions (Signed)
Please get your medications reviewed and adjusted by your Primary MD. ° °Please request your Primary MD to go over all Hospital Tests and Procedure/Radiological results at the follow up, please get all Hospital records sent to your Prim MD by signing hospital release before you go home. ° °If you had Pneumonia of Lung problems at the Hospital: °Please get a 2 view Chest X ray done in 6-8 weeks after hospital discharge or sooner if instructed by your Primary MD. ° °If you have Congestive Heart Failure: °Please call your Cardiologist or Primary MD anytime you have any of the following symptoms:  °1) 3 pound weight gain in 24 hours or 5 pounds in 1 week  °2) shortness of breath, with or without a dry hacking cough  °3) swelling in the hands, feet or stomach  °4) if you have to sleep on extra pillows at night in order to breathe ° °Follow cardiac low salt diet and 1.5 lit/day fluid restriction. ° °If you have diabetes °Accuchecks 4 times/day, Once in AM empty stomach and then before each meal. °Log in all results and show them to your primary doctor at your next visit. °If any glucose reading is under 80 or above 300 call your primary MD immediately. ° °If you have Seizure/Convulsions/Epilepsy: °Please do not drive, operate heavy machinery, participate in activities at heights or participate in high speed sports until you have seen by Primary MD or a Neurologist and advised to do so again. ° °If you had Gastrointestinal Bleeding: °Please ask your Primary MD to check a complete blood count within one week of discharge or at your next visit. Your endoscopic/colonoscopic biopsies that are pending at the time of discharge, will also need to followed by your Primary MD. ° °Get Medicines reviewed and adjusted. °Please take all your medications with you for your next visit with your Primary MD ° °Please request your Primary MD to go over all hospital tests and procedure/radiological results at the follow up, please ask your  Primary MD to get all Hospital records sent to his/her office. ° °If you experience worsening of your admission symptoms, develop shortness of breath, life threatening emergency, suicidal or homicidal thoughts you must seek medical attention immediately by calling 911 or calling your MD immediately  if symptoms less severe. ° °You must read complete instructions/literature along with all the possible adverse reactions/side effects for all the Medicines you take and that have been prescribed to you. Take any new Medicines after you have completely understood and accpet all the possible adverse reactions/side effects.  ° °Do not drive or operate heavy machinery when taking Pain medications.  ° °Do not take more than prescribed Pain, Sleep and Anxiety Medications ° °Special Instructions: If you have smoked or chewed Tobacco  in the last 2 yrs please stop smoking, stop any regular Alcohol  and or any Recreational drug use. ° °Wear Seat belts while driving. ° °Please note °You were cared for by a hospitalist during your hospital stay. If you have any questions about your discharge medications or the care you received while you were in the hospital after you are discharged, you can call the unit and asked to speak with the hospitalist on call if the hospitalist that took care of you is not available. Once you are discharged, your primary care physician will handle any further medical issues. Please note that NO REFILLS for any discharge medications will be authorized once you are discharged, as it is imperative that you   return to your primary care physician (or establish a relationship with a primary care physician if you do not have one) for your aftercare needs so that they can reassess your need for medications and monitor your lab values. ° °You can reach the hospitalist office at phone [phone] or fax [phone] °  °If you do not have a primary care physician, you can call [phone] for a physician  referral. ° ° °Community-Acquired Pneumonia, Adult °Pneumonia is an infection of the lungs. One type of pneumonia can happen while a person is in a hospital. A different type can happen when a person is not in a hospital (community-acquired pneumonia). It is easy for this kind to spread from person to person. It can spread to you if you breathe near an infected person who coughs or sneezes. Some symptoms include: °· A dry cough. °· A wet (productive) cough. °· Fever. °· Sweating. °· Chest pain. ° °Follow these instructions at home: °· Take over-the-counter and prescription medicines only as told by your doctor. °? Only take cough medicine if you are losing sleep. °? If you were prescribed an antibiotic medicine, take it as told by your doctor. Do not stop taking the antibiotic even if you start to feel better. °· Sleep with your head and neck raised (elevated). You can do this by putting a few pillows under your head, or you can sleep in a recliner. °· Do not use tobacco products. These include cigarettes, chewing tobacco, and e-cigarettes. If you need help quitting, ask your doctor. °· Drink enough water to keep your pee (urine) clear or pale yellow. °A shot (vaccine) can help prevent pneumonia. Shots are often suggested for: °· People older than 22 years of age. °· People older than 22 years of age: °? Who are having cancer treatment. °? Who have long-term (chronic) lung disease. °? Who have problems with their body's defense system (immune system). ° °You Barradas also prevent pneumonia if you take these actions: °· Get the flu (influenza) shot every year. °· Go to the dentist as often as told. °· Wash your hands often. If soap and water are not available, use hand sanitizer. ° °Contact a doctor if: °· You have a fever. °· You lose sleep because your cough medicine does not help. °Get help right away if: °· You are short of breath and it gets worse. °· You have more chest pain. °· Your sickness gets worse. This is  very serious if: °? You are an older adult. °? Your body's defense system is weak. °· You cough up blood. °This information is not intended to replace advice given to you by your health care provider. Make sure you discuss any questions you have with your health care provider. °Document Released: 12/29/2007 Document Revised: 12/18/2015 Document Reviewed: 11/06/2014 °Elsevier Interactive Patient Education © 2018 [employer] ° °

## 2018-04-16 LAB — CULTURE, BLOOD (ROUTINE X 2)
Culture: NO GROWTH
Culture: NO GROWTH
SPECIAL REQUESTS: ADEQUATE

## 2018-08-25 ENCOUNTER — Other Ambulatory Visit: Payer: Self-pay

## 2018-08-25 ENCOUNTER — Emergency Department (HOSPITAL_COMMUNITY)
Admission: EM | Admit: 2018-08-25 | Discharge: 2018-08-26 | Disposition: A | Discharge status: Home / Self Care | Payer: BLUE CROSS/BLUE SHIELD | Attending: Emergency Medicine | Admitting: Emergency Medicine

## 2018-08-25 ENCOUNTER — Encounter (HOSPITAL_COMMUNITY): Payer: Self-pay | Admitting: Emergency Medicine

## 2018-08-25 ENCOUNTER — Emergency Department (HOSPITAL_COMMUNITY): Payer: BLUE CROSS/BLUE SHIELD

## 2018-08-25 DIAGNOSIS — Y929 Unspecified place or not applicable: Secondary | ICD-10-CM | POA: Insufficient documentation

## 2018-08-25 DIAGNOSIS — R0789 Other chest pain: Secondary | ICD-10-CM | POA: Diagnosis present

## 2018-08-25 DIAGNOSIS — Z79899 Other long term (current) drug therapy: Secondary | ICD-10-CM | POA: Insufficient documentation

## 2018-08-25 DIAGNOSIS — Y939 Activity, unspecified: Secondary | ICD-10-CM | POA: Insufficient documentation

## 2018-08-25 DIAGNOSIS — S2220XA Unspecified fracture of sternum, initial encounter for closed fracture: Secondary | ICD-10-CM

## 2018-08-25 DIAGNOSIS — Z7982 Long term (current) use of aspirin: Secondary | ICD-10-CM | POA: Insufficient documentation

## 2018-08-25 DIAGNOSIS — Y999 Unspecified external cause status: Secondary | ICD-10-CM | POA: Insufficient documentation

## 2018-08-25 DIAGNOSIS — D682 Hereditary deficiency of other clotting factors: Secondary | ICD-10-CM | POA: Insufficient documentation

## 2018-08-25 DIAGNOSIS — S2222XA Fracture of body of sternum, initial encounter for closed fracture: Secondary | ICD-10-CM | POA: Diagnosis not present
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
## 2018-08-26 ENCOUNTER — Emergency Department (HOSPITAL_COMMUNITY): Payer: BLUE CROSS/BLUE SHIELD

## 2018-08-26 LAB — BASIC METABOLIC PANEL
Anion gap: 11 (ref 5–15)
BUN: 8 mg/dL (ref 6–20)
CALCIUM: 9.9 mg/dL (ref 8.9–10.3)
CO2: 24 mmol/L (ref 22–32)
CREATININE: 0.68 mg/dL (ref 0.44–1.00)
Chloride: 104 mmol/L (ref 98–111)
GFR calc non Af Amer: >60 mL/min (ref >60)
Glucose, Bld: 110 mg/dL — ABNORMAL HIGH (ref 70–99)
Potassium: 3.8 mmol/L (ref 3.5–5.1)
Sodium: 139 mmol/L (ref 135–145)

## 2018-08-26 LAB — CBC WITH DIFFERENTIAL/PLATELET
Abs Immature Granulocytes: 0.08 10*3/uL — ABNORMAL HIGH (ref 0.00–0.07)
Basophils Absolute: 0.1 10*3/uL (ref 0.0–0.1)
Basophils Relative: 0 %
Eosinophils Absolute: 0 10*3/uL (ref 0.0–0.5)
Eosinophils Relative: 0 %
HCT: 43.2 % (ref 36.0–46.0)
Hemoglobin: 14.6 g/dL (ref 12.0–15.0)
Immature Granulocytes: 1 %
Lymphocytes Relative: 13 %
Lymphs Abs: 1.7 10*3/uL (ref 0.7–4.0)
MCH: 28.2 pg (ref 26.0–34.0)
MCHC: 33.8 g/dL (ref 30.0–36.0)
MCV: 83.4 fL (ref 80.0–100.0)
Monocytes Absolute: 0.8 10*3/uL (ref 0.1–1.0)
Monocytes Relative: 7 %
Neutro Abs: 9.7 10*3/uL — ABNORMAL HIGH (ref 1.7–7.7)
Neutrophils Relative %: 79 %
Platelets: 298 10*3/uL (ref 150–400)
RBC: 5.18 MIL/uL — ABNORMAL HIGH (ref 3.87–5.11)
RDW: 12 % (ref 11.5–15.5)
WBC: 12.4 10*3/uL — ABNORMAL HIGH (ref 4.0–10.5)
nRBC: 0 % (ref 0.0–0.2)

## 2018-08-26 LAB — I-STAT BETA HCG BLOOD, ED (MC, WL, AP ONLY)

## 2018-08-26 LAB — I-STAT TROPONIN, ED: Troponin i, poc: 0.01 ng/mL (ref 0.00–0.08)

## 2018-08-26 MED ORDER — IOHEXOL 300 MG/ML  SOLN
75.0000 mL | Freq: Once | INTRAMUSCULAR | Status: AC | PRN
  Administered 2018-08-26: 75 mL via INTRAVENOUS

## 2018-08-26 MED ORDER — ONDANSETRON 4 MG PO TBDP
8.0000 mg | ORAL_TABLET | Freq: Once | ORAL | Status: AC
  Administered 2018-08-26: 8 mg via ORAL
  Filled 2018-08-26: qty 2

## 2018-08-26 MED ORDER — HYDROCODONE-ACETAMINOPHEN 5-325 MG PO TABS: 1.0000 | ORAL_TABLET | Freq: Four times a day (QID) | ORAL | 0 refills | Status: DC | PRN

## 2018-08-26 MED ORDER — HYDROCODONE-ACETAMINOPHEN 5-325 MG PO TABS
2.0000 | ORAL_TABLET | Freq: Once | ORAL | Status: AC
  Administered 2018-08-26: 2 via ORAL
  Filled 2018-08-26: qty 2

## 2018-08-26 NOTE — ED Notes (Signed)
Pt discharge instructions given. Teach back displayed, pt verbalized understanding.

## 2018-08-26 NOTE — ED Notes (Signed)
istat trop was error,  I will send edit sheet to POC to credit patient's account.

## 2018-08-26 NOTE — Discharge Instructions (Addendum)
Hydrocodone as prescribed as needed for pain.  Return to the emergency department for worsening pain, difficulty breathing, or other new and concerning symptoms.

## 2019-05-03 ENCOUNTER — Ambulatory Visit (HOSPITAL_COMMUNITY)
Admission: RE | Admit: 2019-05-03 | Discharge: 2019-05-03 | Disposition: A | Discharge status: Home / Self Care | Payer: BC Managed Care – PPO | Source: Ambulatory Visit | Attending: Cardiology | Admitting: Cardiology

## 2019-05-03 ENCOUNTER — Other Ambulatory Visit (HOSPITAL_COMMUNITY): Payer: Self-pay | Admitting: Sports Medicine

## 2019-05-03 ENCOUNTER — Other Ambulatory Visit: Payer: Self-pay

## 2019-05-03 DIAGNOSIS — M79662 Pain in left lower leg: Secondary | ICD-10-CM

## 2019-05-03 DIAGNOSIS — M7989 Other specified soft tissue disorders: Secondary | ICD-10-CM | POA: Insufficient documentation

## 2019-06-28 ENCOUNTER — Other Ambulatory Visit: Payer: Self-pay | Admitting: Physician Assistant

## 2019-06-28 ENCOUNTER — Ambulatory Visit
Admission: RE | Admit: 2019-06-28 | Discharge: 2019-06-28 | Disposition: A | Discharge status: Home / Self Care | Payer: BC Managed Care – PPO | Source: Ambulatory Visit | Attending: Physician Assistant | Admitting: Physician Assistant

## 2019-06-28 DIAGNOSIS — R05 Cough: Secondary | ICD-10-CM

## 2019-06-28 DIAGNOSIS — R059 Cough, unspecified: Secondary | ICD-10-CM

## 2019-09-25 ENCOUNTER — Encounter (HOSPITAL_COMMUNITY): Payer: Self-pay | Admitting: Obstetrics & Gynecology

## 2019-09-25 ENCOUNTER — Inpatient Hospital Stay (HOSPITAL_COMMUNITY)
Admission: AD | Admit: 2019-09-25 | Discharge: 2019-09-25 | Disposition: A | Discharge status: Home / Self Care | Payer: BC Managed Care – PPO | Attending: Obstetrics & Gynecology | Admitting: Obstetrics & Gynecology

## 2019-09-25 ENCOUNTER — Other Ambulatory Visit: Payer: Self-pay

## 2019-09-25 DIAGNOSIS — Z3A01 Less than 8 weeks gestation of pregnancy: Secondary | ICD-10-CM | POA: Diagnosis not present

## 2019-09-25 DIAGNOSIS — O210 Mild hyperemesis gravidarum: Secondary | ICD-10-CM | POA: Insufficient documentation

## 2019-09-25 DIAGNOSIS — O219 Vomiting of pregnancy, unspecified: Secondary | ICD-10-CM | POA: Diagnosis not present

## 2019-09-25 DIAGNOSIS — Z7982 Long term (current) use of aspirin: Secondary | ICD-10-CM | POA: Insufficient documentation

## 2019-09-25 LAB — URINALYSIS, ROUTINE W REFLEX MICROSCOPIC
Bilirubin Urine: NEGATIVE
Glucose, UA: NEGATIVE mg/dL
Hgb urine dipstick: NEGATIVE
Ketones, ur: 20 mg/dL — AB
Leukocytes,Ua: NEGATIVE
Nitrite: NEGATIVE
Protein, ur: NEGATIVE mg/dL
Specific Gravity, Urine: 1.018 (ref 1.005–1.030)
pH: 6 (ref 5.0–8.0)

## 2019-09-25 LAB — POCT PREGNANCY, URINE: Preg Test, Ur: POSITIVE — AB

## 2019-09-25 MED ORDER — LACTATED RINGERS IV BOLUS
1000.0000 mL | Freq: Once | INTRAVENOUS | Status: AC
  Administered 2019-09-25: 1000 mL via INTRAVENOUS

## 2019-09-25 NOTE — MAU Provider Note (Signed)
History     CSN: [id number]  Arrival date and time: 09/25/19 1521   First Provider Initiated Contact with Patient 09/25/19 1714     Chief Complaint  Patient presents with  . Nausea   HPI Bonnie Adkins is a 24 y.o. G1P0 at [redacted]w[redacted]d who presents to MAU with chief complaint of nausea and vomiting in first trimester. She endorses recurrent nausea with occasional episodes of vomiting. She has vomited once in the past 24 hours. She has attempted management with B6 and 1/2 of a Unisom tablet, which she initiated last night and took for the second time this morning.  Patient's problem list includes IBS and she has also tried to manage her nausea by eating rice, applesauce, Sprite, Ginger Ale and Gatorade. She most recently attempted a bite of her husband's [employer] cheeseburger and french fries but was unable to tolerate them.  She denies abdominal pain, vaginal bleeding, dysuria, fever or recent illness.  She plans to initiate prenatal care with [facility] and has a New OB appointment scheduled for 10/05/2019.  OB History    Gravida  1   Para      Term      Preterm      AB      Living        SAB      TAB      Ectopic      Multiple      Live Births              Past Medical History:  Diagnosis Date  . Abdominal pain, recurrent   . Diarrhea   . Factor II deficiency (HCC)   . High cholesterol   . Hypoglycemia   . IBS (irritable bowel syndrome)   . PCOS (polycystic ovarian syndrome)     Past Surgical History:  Procedure Laterality Date  . MANDIBLE SURGERY    . TEE WITHOUT CARDIOVERSION N/A 12/07/2016   Procedure: TRANSESOPHAGEAL ECHOCARDIOGRAM (TEE);  Surgeon: Yates Decamp, MD;  Location: [facility] ENDOSCOPY;  Service: Cardiovascular;  Laterality: N/A;  . TYMPANOSTOMY TUBE PLACEMENT      Family History  Problem Relation Age of Onset  . Venous thrombosis Mother     Social History   Tobacco Use  . Smoking status: Never Smoker  . Smokeless tobacco: Never Used   Substance Use Topics  . Alcohol use: No  . Drug use: No    Allergies:  Allergies  Allergen Reactions  . Pseudoephedrine Hcl Palpitations  . Azithromycin Other (See Comments)    eregulatarit on EKG    Medications Prior to Admission  Medication Sig Dispense Refill Last Dose  . doxylamine, Sleep, (UNISOM) 25 MG tablet Take 25 mg by mouth at bedtime as needed.   09/24/2019 at Unknown time  . enoxaparin (LOVENOX) 40 MG/0.4ML injection Inject 40 mg into the skin daily.   09/24/2019 at Unknown time  . metFORMIN (GLUCOPHAGE-XR) 500 MG 24 hr tablet Take 1 tablet (500 mg total) by mouth 2 (two) times daily. This was held in the hospital.  Restart taking it from 04/13/2018.   09/24/2019 at Unknown time  . Prenatal Vit-Fe Fumarate-FA (PRENATAL MULTIVITAMIN) TABS tablet Take 1 tablet by mouth daily at 12 noon.   09/24/2019 at Unknown time  . sertraline (ZOLOFT) 50 MG tablet Take 50 mg by mouth at bedtime.   09/24/2019 at Unknown time  . vitamin B-6 (PYRIDOXINE) 25 MG tablet Take 25 mg by mouth daily.   09/24/2019 at Unknown time  .  acetaminophen (TYLENOL) 500 MG tablet Take 500 mg by mouth every 6 (six) hours as needed for mild pain. For pain     . aspirin 81 MG EC tablet Take 1 tablet (81 mg total) by mouth at bedtime.     . cetirizine (ZYRTEC) 10 MG tablet Take 10 mg by mouth at bedtime.      Marland Kitchen HYDROcodone-acetaminophen (NORCO) 5-325 MG tablet Take 1-2 tablets by mouth every 6 (six) hours as needed. 15 tablet 0   . norethindrone (DEBLITANE) 0.35 MG tablet Take 1 tablet by mouth at bedtime.      . Probiotic Product (PROBIOTIC PO) Take 1 tablet by mouth at bedtime.      . rosuvastatin (CRESTOR) 20 MG tablet Take 20 mg by mouth at bedtime.        Review of Systems  Constitutional: Negative for chills, fatigue and fever.  Respiratory: Negative for shortness of breath.   Gastrointestinal: Positive for nausea and vomiting. Negative for abdominal pain.  Genitourinary: Negative for vaginal bleeding, vaginal  discharge and vaginal pain.  Musculoskeletal: Negative for back pain.  Neurological: Negative for dizziness and light-headedness.  All other systems reviewed and are negative.  Physical Exam   Blood pressure 128/79, pulse 95, temperature 97.8 F (36.6 C), resp. rate 16, height 5\' 3"  (1.6 m), weight 70.9 kg, last menstrual period 08/15/2019, SpO2 100 %.  Physical Exam  Nursing note and vitals reviewed. Constitutional: She is oriented to person, place, and time. She appears well-developed and well-nourished.  Cardiovascular: Normal rate and normal heart sounds.  Respiratory: Effort normal and breath sounds normal. No respiratory distress.  GI: Soft. Bowel sounds are normal. She exhibits no distension. There is no abdominal tenderness. There is no rebound, no guarding and no CVA tenderness.  Musculoskeletal:        General: Normal range of motion.  Neurological: She is alert and oriented to person, place, and time.  Skin: Skin is warm and dry.  Psychiatric: She has a normal mood and affect. Her behavior is normal. Judgment and thought content normal.    MAU Course/MDM  Procedures  --Patient with hx of QT prolongation. Reglan and Phenergan contraindicated --Discussed revising environment and diet to minimize strong smells and flavors. Discussed limiting intake of large volumes of liquid (e.g. Gatorade). Advised paced eating with simple bland foods, small amounts of protein especially at bedtime.   Patient Vitals for the past 24 hrs:  BP Temp Pulse Resp SpO2 Height Weight  09/25/19 1538 128/79 97.8 F (36.6 C) 95 16 100 % 5\' 3"  (1.6 m) 70.9 kg   Orders Placed This Encounter  Procedures  . Urinalysis, Routine w reflex microscopic  . Pregnancy, urine POC  . Insert peripheral IV   Meds ordered this encounter  Medications  . lactated ringers bolus 1,000 mL   Results for orders placed or performed during the hospital encounter of 09/25/19 (from the past 24 hour(s))  Pregnancy,  urine POC     Status: Abnormal   Collection Time: 09/25/19  3:50 PM  Result Value Ref Range   Preg Test, Ur POSITIVE (A) NEGATIVE  Urinalysis, Routine w reflex microscopic     Status: Abnormal   Collection Time: 09/25/19  3:51 PM  Result Value Ref Range   Color, Urine YELLOW YELLOW   APPearance HAZY (A) CLEAR   Specific Gravity, Urine 1.018 1.005 - 1.030   pH 6.0 5.0 - 8.0   Glucose, UA NEGATIVE NEGATIVE mg/dL   Hgb urine dipstick NEGATIVE  NEGATIVE   Bilirubin Urine NEGATIVE NEGATIVE   Ketones, ur 20 (A) NEGATIVE mg/dL   Protein, ur NEGATIVE NEGATIVE mg/dL   Nitrite NEGATIVE NEGATIVE   Leukocytes,Ua NEGATIVE NEGATIVE   Assessment and Plan  --24 y.o. G1P0 at [redacted]w[redacted]d  --Nausea with occasional vomiting in early pregnancy --Discharge home in stable condition  F/U: --[facility] 10/05/2019  Calvert Cantor, CNM 09/25/2019, 7:54 PM

## 2019-09-25 NOTE — Discharge Instructions (Signed)
Nausea and Vomiting in First Trimester What are the causes? The cause of this condition is not known. It Devaul be related to changes in chemicals (hormones) in the body during pregnancy, such as the high level of pregnancy hormone (human chorionic gonadotropin) or the increase in the female sex hormone (estrogen). What are the signs or symptoms? Symptoms of this condition include:  Nausea that does not go away.  Vomiting that does not allow you to keep any food down.  Weight loss.  Body fluid loss (dehydration).  Having no desire to eat, or not liking food that you have previously enjoyed. How is this diagnosed? This condition Flener be diagnosed based on:  A physical exam.  Your medical history.  Your symptoms.  Blood tests.  Urine tests. How is this treated? This condition is managed by controlling symptoms. This Umholtz include:  Following an eating plan. This can help lessen nausea and vomiting.  Taking prescription medicines. An eating plan and medicines are often used together to help control symptoms. If medicines do not help relieve nausea and vomiting, you Fraleigh need to receive fluids through an IV at the hospital. Follow these instructions at home: Eating and drinking   Avoid the following: ? Drinking fluids with meals. Try not to drink anything during the 30 minutes before and after your meals. ? Drinking more than 1 cup of fluid at a time. ? Eating foods that trigger your symptoms. These Patteson include spicy foods, coffee, high-fat foods, very sweet foods, and acidic foods. ? Skipping meals. Nausea can be more intense on an empty stomach. If you cannot tolerate food, do not force it. Try sucking on ice chips or other frozen items and make up for missed calories later. ? Lying down within 2 hours after eating. ? Being exposed to environmental triggers. These Talarico include food smells, smoky rooms, closed spaces, rooms with strong smells, warm or humid places, overly loud and  noisy rooms, and rooms with motion or flickering lights. Try eating meals in a well-ventilated area that is free of strong smells. ? Quick and sudden changes in your movement. ? Taking iron pills and multivitamins that contain iron. If you take prescription iron pills, do not stop taking them unless your health care provider approves. ? Preparing food. The smell of food can spoil your appetite or trigger nausea.  To help relieve your symptoms: ? Listen to your body. Everyone is different and has different preferences. Find what works best for you. ? Eat and drink slowly. ? Eat 5-6 small meals daily instead of 3 large meals. Eating small meals and snacks can help you avoid an empty stomach. ? In the morning, before getting out of bed, eat a couple of crackers to avoid moving around on an empty stomach. ? Try eating starchy foods as these are usually tolerated well. Examples include cereal, toast, bread, potatoes, pasta, rice, and pretzels. ? Include at least 1 serving of protein with your meals and snacks. Protein options include lean meats, poultry, seafood, beans, nuts, nut butters, eggs, cheese, and yogurt. ? Try eating a protein-rich snack before bed. Examples of a protein-rick snack include cheese and crackers or a peanut butter sandwich made with 1 slice of whole-wheat bread and 1 tsp (5 g) of peanut butter. ? Eat or suck on things that have ginger in them. It Dell help relieve nausea. Add  tsp ground ginger to hot tea or choose ginger tea. ? Try drinking 100% fruit juice or an electrolyte  drink. An electrolyte drink contains sodium, potassium, and chloride. ? Drink fluids that are cold, clear, and carbonated or sour. Examples include lemonade, ginger ale, lemon-lime soda, ice water, and sparkling water. ? Brush your teeth or use a mouth rinse after meals. ? Talk with your health care provider about starting a supplement of vitamin B6. General instructions  Take over-the-counter and  prescription medicines only as told by your health care provider.  Follow instructions from your health care provider about eating or drinking restrictions.  Continue to take your prenatal vitamins as told by your health care provider. If you are having trouble taking your prenatal vitamins, talk with your health care provider about different options.  Keep all follow-up and pre-birth (prenatal) visits as told by your health care provider. This is important. Contact a health care provider if:  You have pain in your abdomen.  You have a severe headache.  You have vision problems.  You are losing weight.  You feel weak or dizzy. Get help right away if:  You cannot drink fluids without vomiting.  You vomit blood.  You have constant nausea and vomiting.  You are very weak.  You faint.  You have a fever and your symptoms suddenly get worse. Summary  Making some changes to your eating habits Robar help relieve nausea and vomiting.  This condition App be managed with medicine.  If medicines do not help relieve nausea and vomiting, you Tkach need to receive fluids through an IV at the hospital. This information is not intended to replace advice given to you by your health care provider. Make sure you discuss any questions you have with your health care provider. Document Revised: 08/01/2017 Document Reviewed: 03/10/2016 Elsevier Patient Education  2020 [employer].

## 2019-09-25 NOTE — MAU Note (Signed)
Been super nauseous since Sat.  Threw up a couple times, hasn't really been able to eat or drink much. Was told to start unisom and vit b6 yesterday, has taken "1 1/2 doses". Has not thrown up since last night.  Has not been seen yet.

## 2019-10-23 LAB — OB RESULTS CONSOLE GC/CHLAMYDIA
Chlamydia: NEGATIVE
Gonorrhea: NEGATIVE

## 2019-10-23 LAB — OB RESULTS CONSOLE RPR: RPR: NONREACTIVE

## 2019-10-23 LAB — OB RESULTS CONSOLE HEPATITIS B SURFACE ANTIGEN: Hepatitis B Surface Ag: NEGATIVE

## 2019-10-23 LAB — OB RESULTS CONSOLE RUBELLA ANTIBODY, IGM: Rubella: NON-IMMUNE/NOT IMMUNE

## 2020-02-21 LAB — OB RESULTS CONSOLE HIV ANTIBODY (ROUTINE TESTING): HIV: NONREACTIVE

## 2020-04-09 ENCOUNTER — Encounter (HOSPITAL_COMMUNITY): Payer: Self-pay | Admitting: Obstetrics and Gynecology

## 2020-04-09 ENCOUNTER — Other Ambulatory Visit: Payer: Self-pay

## 2020-04-09 ENCOUNTER — Inpatient Hospital Stay (HOSPITAL_COMMUNITY)
Admission: AD | Admit: 2020-04-09 | Discharge: 2020-04-09 | Disposition: A | Discharge status: Home / Self Care | Payer: BC Managed Care – PPO | Attending: Obstetrics and Gynecology | Admitting: Obstetrics and Gynecology

## 2020-04-09 DIAGNOSIS — O99113 Other diseases of the blood and blood-forming organs and certain disorders involving the immune mechanism complicating pregnancy, third trimester: Secondary | ICD-10-CM | POA: Insufficient documentation

## 2020-04-09 DIAGNOSIS — Z7982 Long term (current) use of aspirin: Secondary | ICD-10-CM | POA: Insufficient documentation

## 2020-04-09 DIAGNOSIS — R109 Unspecified abdominal pain: Secondary | ICD-10-CM | POA: Insufficient documentation

## 2020-04-09 DIAGNOSIS — E7800 Pure hypercholesterolemia, unspecified: Secondary | ICD-10-CM | POA: Insufficient documentation

## 2020-04-09 DIAGNOSIS — O99283 Endocrine, nutritional and metabolic diseases complicating pregnancy, third trimester: Secondary | ICD-10-CM | POA: Insufficient documentation

## 2020-04-09 DIAGNOSIS — Z3A34 34 weeks gestation of pregnancy: Secondary | ICD-10-CM | POA: Insufficient documentation

## 2020-04-09 DIAGNOSIS — O26893 Other specified pregnancy related conditions, third trimester: Secondary | ICD-10-CM | POA: Insufficient documentation

## 2020-04-09 DIAGNOSIS — E282 Polycystic ovarian syndrome: Secondary | ICD-10-CM | POA: Insufficient documentation

## 2020-04-09 DIAGNOSIS — O4703 False labor before 37 completed weeks of gestation, third trimester: Secondary | ICD-10-CM | POA: Diagnosis not present

## 2020-04-09 DIAGNOSIS — D682 Hereditary deficiency of other clotting factors: Secondary | ICD-10-CM | POA: Diagnosis not present

## 2020-04-09 DIAGNOSIS — Z79899 Other long term (current) drug therapy: Secondary | ICD-10-CM | POA: Diagnosis not present

## 2020-04-09 DIAGNOSIS — E162 Hypoglycemia, unspecified: Secondary | ICD-10-CM | POA: Insufficient documentation

## 2020-04-09 DIAGNOSIS — Z7984 Long term (current) use of oral hypoglycemic drugs: Secondary | ICD-10-CM | POA: Diagnosis not present

## 2020-04-09 DIAGNOSIS — Z7901 Long term (current) use of anticoagulants: Secondary | ICD-10-CM | POA: Diagnosis not present

## 2020-04-09 DIAGNOSIS — Z881 Allergy status to other antibiotic agents status: Secondary | ICD-10-CM | POA: Diagnosis not present

## 2020-04-09 DIAGNOSIS — O479 False labor, unspecified: Secondary | ICD-10-CM

## 2020-04-09 LAB — URINALYSIS, ROUTINE W REFLEX MICROSCOPIC
Bilirubin Urine: NEGATIVE
Glucose, UA: NEGATIVE mg/dL
Hgb urine dipstick: NEGATIVE
Ketones, ur: 15 mg/dL — AB
Leukocytes,Ua: NEGATIVE
Nitrite: NEGATIVE
Protein, ur: NEGATIVE mg/dL
Specific Gravity, Urine: 1.02 (ref 1.005–1.030)
pH: 8 (ref 5.0–8.0)

## 2020-04-09 MED ORDER — LACTATED RINGERS IV BOLUS
1000.0000 mL | Freq: Once | INTRAVENOUS | Status: AC
  Administered 2020-04-09: 1000 mL via INTRAVENOUS

## 2020-04-09 NOTE — MAU Provider Note (Signed)
History     CSN: [id number]  Arrival date and time: 04/09/20 4098   First Provider Initiated Contact with Patient 04/09/20 1114      Chief Complaint  Patient presents with  . Abdominal Pain   Bonnie Adkins is a 24 y.o. G1P0 at [redacted]w[redacted]d who presents today with cramping and back pain. She states that the pain started last week after stomach upset, but has gotten worse since yesterday. She denies any bleeding or leaking of fluid. She states that the pain is constant, but then intermittently increases. She denies any complications with this pregnancy other than anemia. Next OB visit is 04/10/2020.   Pelvic Pain The patient's primary symptoms include pelvic pain. This is a new problem. The current episode started in the past 7 days. The problem occurs constantly. The problem has been gradually worsening. The problem affects both sides. She is pregnant. Associated symptoms include diarrhea. Pertinent negatives include no chills, fever, nausea or vomiting. The vaginal discharge was normal. There has been no bleeding. Nothing aggravates the symptoms. She has tried warm baths (rest) for the symptoms. The treatment provided no relief.    OB History    Gravida  1   Para      Term      Preterm      AB      Living        SAB      TAB      Ectopic      Multiple      Live Births              Past Medical History:  Diagnosis Date  . Abdominal pain, recurrent   . Diarrhea   . Factor II deficiency (HCC)   . High cholesterol   . Hypoglycemia   . IBS (irritable bowel syndrome)   . PCOS (polycystic ovarian syndrome)     Past Surgical History:  Procedure Laterality Date  . MANDIBLE SURGERY    . TEE WITHOUT CARDIOVERSION N/A 12/07/2016   Procedure: TRANSESOPHAGEAL ECHOCARDIOGRAM (TEE);  Surgeon: Yates Decamp, MD;  Location: [facility] ENDOSCOPY;  Service: Cardiovascular;  Laterality: N/A;  . TYMPANOSTOMY TUBE PLACEMENT      Family History  Problem Relation Age of Onset  . Venous  thrombosis Mother     Social History   Tobacco Use  . Smoking status: Never Smoker  . Smokeless tobacco: Never Used  Vaping Use  . Vaping Use: Never used  Substance Use Topics  . Alcohol use: No  . Drug use: No    Allergies:  Allergies  Allergen Reactions  . Pseudoephedrine Hcl Palpitations  . Azithromycin Other (See Comments)    eregulatarit on EKG    Medications Prior to Admission  Medication Sig Dispense Refill Last Dose  . acetaminophen (TYLENOL) 500 MG tablet Take 500 mg by mouth every 6 (six) hours as needed for mild pain. For pain   Past Month at Unknown time  . aspirin 81 MG EC tablet Take 1 tablet (81 mg total) by mouth at bedtime.   04/08/2020 at 2100  . cetirizine (ZYRTEC) 10 MG tablet Take 10 mg by mouth at bedtime.    04/08/2020 at 2100  . metFORMIN (GLUCOPHAGE-XR) 500 MG 24 hr tablet Take 1 tablet (500 mg total) by mouth 2 (two) times daily. This was held in the hospital.  Restart taking it from 04/13/2018. (Patient taking differently: Take 500 mg by mouth once. This was held in the hospital.  Restart taking it  from 04/13/2018.)   04/08/2020 at 2100  . Prenatal Vit-Fe Fumarate-FA (PRENATAL MULTIVITAMIN) TABS tablet Take 1 tablet by mouth daily at 12 noon.   04/09/2020 at 0800  . sertraline (ZOLOFT) 50 MG tablet Take 50 mg by mouth at bedtime.   04/08/2020 at 2100  . doxylamine, Sleep, (UNISOM) 25 MG tablet Take 25 mg by mouth at bedtime as needed.     . enoxaparin (LOVENOX) 40 MG/0.4ML injection Inject 40 mg into the skin daily.   04/07/2020  . HYDROcodone-acetaminophen (NORCO) 5-325 MG tablet Take 1-2 tablets by mouth every 6 (six) hours as needed. 15 tablet 0   . Probiotic Product (PROBIOTIC PO) Take 1 tablet by mouth at bedtime.      . rosuvastatin (CRESTOR) 20 MG tablet Take 20 mg by mouth at bedtime.      . vitamin B-6 (PYRIDOXINE) 25 MG tablet Take 25 mg by mouth daily.       Review of Systems  Constitutional: Negative for chills and fever.  Gastrointestinal:  Positive for diarrhea. Negative for nausea and vomiting.  Genitourinary: Positive for pelvic pain.   Physical Exam   Blood pressure 131/80, pulse (!) 113, temperature 97.8 F (36.6 C), resp. rate 18, height 5\' 3"  (1.6 m), weight 79.4 kg, last menstrual period 08/15/2019, SpO2 99 %.  Physical Exam Vitals and nursing note reviewed. Exam conducted with a chaperone present.  Constitutional:      General: She is not in acute distress. HENT:     Head: Normocephalic.  Cardiovascular:     Rate and Rhythm: Normal rate.  Pulmonary:     Effort: Pulmonary effort is normal.  Abdominal:     Palpations: Abdomen is soft.  Genitourinary:    Comments:  Dilation: Fingertip Effacement (%): Thick Station: -1 Presentation: Vertex Exam by:: [email or web address], CNM\  Skin:    General: Skin is warm and dry.  Neurological:     Mental Status: She is alert and oriented to person, place, and time.  Psychiatric:        Mood and Affect: Mood normal.        Behavior: Behavior normal.      NST:  Baseline: 140 Variability: moderate Accels: 15x15 Decels: none Toco: irregular between 3-8 mins  Reactive/Appropriate for GA  Results for orders placed or performed during the hospital encounter of 04/09/20 (from the past 24 hour(s))  Urinalysis, Routine w reflex microscopic Urine, Clean Catch     Status: Abnormal   Collection Time: 04/09/20 11:25 AM  Result Value Ref Range   Color, Urine YELLOW YELLOW   APPearance HAZY (A) CLEAR   Specific Gravity, Urine 1.020 1.005 - 1.030   pH 8.0 5.0 - 8.0   Glucose, UA NEGATIVE NEGATIVE mg/dL   Hgb urine dipstick NEGATIVE NEGATIVE   Bilirubin Urine NEGATIVE NEGATIVE   Ketones, ur 15 (A) NEGATIVE mg/dL   Protein, ur NEGATIVE NEGATIVE mg/dL   Nitrite NEGATIVE NEGATIVE   Leukocytes,Ua NEGATIVE NEGATIVE    MAU Course  Procedures  MDM Patient has had 1L of LR bolus. Contractions have spaced some on the monitor, and patient reports that she feels somewhat better.    Patient reassured that cervix is not dilated. Encouraged patient to take breaks, rest at work when needed and maintain hydration as best as possible while working. Patient has an OB visit on 04/10/2020. She will keep that appt, but knows to return here as needed for emergencies.     Assessment and Plan   1. Braxton  Hicks contractions   2. [redacted] weeks gestation of pregnancy    DC home  Likely related to dehydration from diarrhea last week.  Increase PO hydration at home PTL precautions reviewed Fetal kick counts Return to MAU as needed for emergencies.   Thressa Sheller DNP, CNM  04/09/20  12:31 PM     Follow-up Information    Morris, Aundra Millet, DO Follow up.   Specialty: Obstetrics and Gynecology Contact information: [address] [place] [address] [phone]

## 2020-04-09 NOTE — Discharge Instructions (Signed)
Preterm Labor and Birth Information Pregnancy normally lasts 39-41 weeks. Preterm labor is when labor starts early. It starts before you have been pregnant for 37 whole weeks. What are the risk factors for preterm labor? Preterm labor is more likely to occur in women who:  Have an infection while pregnant.  Have a cervix that is short.  Have gone into preterm labor before.  Have had surgery on their cervix.  Are younger than age 24.  Are older than age 35.  Are African American.  Are pregnant with two or more babies.  Take street drugs while pregnant.  Smoke while pregnant.  Do not gain enough weight while pregnant.  Got pregnant right after another pregnancy. What are the symptoms of preterm labor? Symptoms of preterm labor include:  Cramps. The cramps Ng feel like the cramps some women get during their period. The cramps [place] happen with watery poop (diarrhea).  Pain in the belly (abdomen).  Pain in the lower back.  Regular contractions or tightening. It Ware feel like your belly is getting tighter.  Pressure in the lower belly that seems to get stronger.  More fluid (discharge) leaking from the vagina. The fluid [place] be watery or bloody.  Water breaking. Why is it important to notice signs of preterm labor? Babies who are born early Pulse not be fully developed. They have a higher chance for:  Long-term heart problems.  Long-term lung problems.  Trouble controlling body systems, like breathing.  Bleeding in the brain.  A condition called cerebral palsy.  Learning difficulties.  Death. These risks are highest for babies who are born before 34 weeks of pregnancy. How is preterm labor treated? Treatment depends on:  How long you were pregnant.  Your condition.  The health of your baby. Treatment [place] involve:  Having a stitch (suture) placed in your cervix. When you give birth, your cervix opens so the baby can come out. The stitch keeps the cervix  from opening too soon.  Staying at the hospital.  Taking or getting medicines, such as: ? Hormone medicines. ? Medicines to stop contractions. ? Medicines to help the baby's lungs develop. ? Medicines to prevent your baby from having cerebral palsy. What should I do if I am in preterm labor? If you think you are going into labor too soon, call your doctor right away. How can I prevent preterm labor?  Do not use any tobacco products. ? Examples of these are cigarettes, chewing tobacco, and e-cigarettes. ? If you need help quitting, ask your doctor.  Do not use street drugs.  Do not use any medicines unless you ask your doctor if they are safe for you.  Talk with your doctor before taking any herbal supplements.  Make sure you gain enough weight.  Watch for infection. If you think you might have an infection, get it checked right away.  If you have gone into preterm labor before, tell your doctor. This information is not intended to replace advice given to you by your health care provider. Make sure you discuss any questions you have with your health care provider. Document Revised: 11/03/2018 Document Reviewed: 12/03/2015 Elsevier Patient Education  2020 Elsevier Inc.  

## 2020-04-09 NOTE — MAU Note (Signed)
. °  Bonnie Adkins is a 24 y.o. at [redacted]w[redacted]d here in MAU reporting: she has had abdominal cramping for a couple of weeks, but has noticed an increase in cramping and lower abdominal pressure. Had an appointment yesterday and a vag exam was not done by physician. Denies any VB or LOF  Onset of complaint: weeks Pain score: 5 Vitals:   04/09/20 1018  BP: 131/80  Pulse: (!) 113  Resp: 18  Temp: 97.8 F (36.6 C)  SpO2: 99%     FHT:135 Lab orders placed from triage: UA

## 2020-04-29 ENCOUNTER — Encounter (HOSPITAL_COMMUNITY): Payer: Self-pay | Admitting: *Deleted

## 2020-04-29 ENCOUNTER — Telehealth (HOSPITAL_COMMUNITY): Payer: Self-pay | Admitting: *Deleted

## 2020-04-29 NOTE — Telephone Encounter (Signed)
Preadmission screen  

## 2020-04-30 ENCOUNTER — Other Ambulatory Visit (HOSPITAL_COMMUNITY)
Admission: RE | Admit: 2020-04-30 | Discharge: 2020-04-30 | Disposition: A | Discharge status: Home / Self Care | Payer: BC Managed Care – PPO | Source: Ambulatory Visit | Attending: Obstetrics and Gynecology | Admitting: Obstetrics and Gynecology

## 2020-04-30 DIAGNOSIS — Z01818 Encounter for other preprocedural examination: Secondary | ICD-10-CM | POA: Insufficient documentation

## 2020-04-30 DIAGNOSIS — Z20822 Contact with and (suspected) exposure to covid-19: Secondary | ICD-10-CM | POA: Insufficient documentation

## 2020-04-30 LAB — SARS CORONAVIRUS 2 (TAT 6-24 HRS): SARS Coronavirus 2: NEGATIVE

## 2020-04-30 NOTE — H&P (Signed)
Bonnie Adkins is a 24 y.o. 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 Abdominal pain, recurrent   . Diarrhea   . Factor II deficiency (HCC)   . High cholesterol   . Hypoglycemia   . IBS (irritable bowel syndrome)   . PCOS (polycystic ovarian syndrome)   . Pregnancy induced hypertension    Past Surgical History:  Procedure Laterality Date  . MANDIBLE SURGERY    . TEE WITHOUT CARDIOVERSION N/A 12/07/2016   Procedure: TRANSESOPHAGEAL ECHOCARDIOGRAM (TEE);  Surgeon: Yates Decamp, MD;  Location: [facility] ENDOSCOPY;  Service: Cardiovascular;  Laterality: N/A;  . TYMPANOSTOMY TUBE PLACEMENT     Family History: family history includes Venous thrombosis in her mother. Social History:  reports that she has never smoked. She has never used smokeless tobacco. She reports that she does not drink alcohol and does not use drugs.     Maternal Diabetes: No Genetic Screening: Normal Maternal Ultrasounds/Referrals: Normal Fetal Ultrasounds or other Referrals:  None Maternal Substance Abuse:  No Significant Maternal Medications:  Meds include: Other: lovenox and sertraline Significant Maternal Lab Results:  Group B Strep negative Other Comments:  None  Review of Systems History   Last menstrual period 08/15/2019. Exam Physical Exam  Prenatal labs: ABO, Rh:  O negative Antibody:  negative Rubella:  non immune RPR:   negative HBsAg:  neg  HIV:   neg GBS:   negative  Assessment/Plan: IUP at term  Prothrombin gene mutation Prolonged QT interval history  IOL AROM  Possible pitocin Epidural lovenox  postpartum  Jeani Hawking 04/30/2020, 8:57 AM

## 2020-05-01 ENCOUNTER — Inpatient Hospital Stay (HOSPITAL_COMMUNITY): Payer: BC Managed Care – PPO | Admitting: Anesthesiology

## 2020-05-01 ENCOUNTER — Encounter (HOSPITAL_COMMUNITY): Payer: Self-pay | Admitting: Obstetrics and Gynecology

## 2020-05-01 ENCOUNTER — Inpatient Hospital Stay (HOSPITAL_COMMUNITY): Payer: BC Managed Care – PPO

## 2020-05-01 ENCOUNTER — Other Ambulatory Visit: Payer: Self-pay

## 2020-05-01 ENCOUNTER — Encounter (HOSPITAL_COMMUNITY)
Admission: AD | Disposition: A | Discharge status: Home / Self Care | Payer: Self-pay | Source: Home / Self Care | Attending: Obstetrics and Gynecology

## 2020-05-01 ENCOUNTER — Inpatient Hospital Stay (HOSPITAL_COMMUNITY)
Admission: AD | Admit: 2020-05-01 | Discharge: 2020-05-03 | DRG: 787 | Disposition: A | Discharge status: Home / Self Care | Payer: BC Managed Care – PPO | Attending: Obstetrics and Gynecology | Admitting: Obstetrics and Gynecology

## 2020-05-01 DIAGNOSIS — Z20822 Contact with and (suspected) exposure to covid-19: Secondary | ICD-10-CM | POA: Diagnosis present

## 2020-05-01 DIAGNOSIS — Z01812 Encounter for preprocedural laboratory examination: Secondary | ICD-10-CM | POA: Diagnosis not present

## 2020-05-01 DIAGNOSIS — R9431 Abnormal electrocardiogram [ECG] [EKG]: Secondary | ICD-10-CM | POA: Diagnosis present

## 2020-05-01 DIAGNOSIS — Z23 Encounter for immunization: Secondary | ICD-10-CM | POA: Diagnosis not present

## 2020-05-01 DIAGNOSIS — O9912 Other diseases of the blood and blood-forming organs and certain disorders involving the immune mechanism complicating childbirth: Secondary | ICD-10-CM | POA: Diagnosis present

## 2020-05-01 DIAGNOSIS — D6852 Prothrombin gene mutation: Secondary | ICD-10-CM | POA: Diagnosis present

## 2020-05-01 DIAGNOSIS — Z3A37 37 weeks gestation of pregnancy: Secondary | ICD-10-CM | POA: Diagnosis not present

## 2020-05-01 DIAGNOSIS — Z3490 Encounter for supervision of normal pregnancy, unspecified, unspecified trimester: Secondary | ICD-10-CM

## 2020-05-01 DIAGNOSIS — Z98891 History of uterine scar from previous surgery: Secondary | ICD-10-CM

## 2020-05-01 LAB — CBC
HCT: 35.2 % — ABNORMAL LOW (ref 36.0–46.0)
HCT: 33.7 % — ABNORMAL LOW (ref 36.0–46.0)
Hemoglobin: 11.4 g/dL — ABNORMAL LOW (ref 12.0–15.0)
Hemoglobin: 10.6 g/dL — ABNORMAL LOW (ref 12.0–15.0)
MCH: 27.5 pg (ref 26.0–34.0)
MCH: 27.2 pg (ref 26.0–34.0)
MCHC: 32.4 g/dL (ref 30.0–36.0)
MCHC: 31.5 g/dL (ref 30.0–36.0)
MCV: 84.8 fL (ref 80.0–100.0)
MCV: 86.6 fL (ref 80.0–100.0)
Platelets: 262 10*3/uL (ref 150–400)
Platelets: 213 10*3/uL (ref 150–400)
RBC: 4.15 MIL/uL (ref 3.87–5.11)
RBC: 3.89 MIL/uL (ref 3.87–5.11)
RDW: 14.4 % (ref 11.5–15.5)
RDW: 14.5 % (ref 11.5–15.5)
WBC: 9.1 10*3/uL (ref 4.0–10.5)
WBC: 17.6 10*3/uL — ABNORMAL HIGH (ref 4.0–10.5)
nRBC: 0 % (ref 0.0–0.2)
nRBC: 0 % (ref 0.0–0.2)

## 2020-05-01 LAB — TYPE AND SCREEN
ABO/RH(D): O NEG
Antibody Screen: NEGATIVE

## 2020-05-01 LAB — RPR: RPR Ser Ql: NONREACTIVE

## 2020-05-01 SURGERY — Surgical Case
Anesthesia: Epidural | Wound class: Clean Contaminated

## 2020-05-01 MED ORDER — COCONUT OIL OIL: 1.0000 "application " | TOPICAL_OIL | Status: DC | PRN

## 2020-05-01 MED ORDER — NALBUPHINE HCL 10 MG/ML IJ SOLN: 5.0000 mg | INTRAMUSCULAR | Status: DC | PRN

## 2020-05-01 MED ORDER — NALBUPHINE HCL 10 MG/ML IJ SOLN: 5.0000 mg | Freq: Once | INTRAMUSCULAR | Status: DC | PRN

## 2020-05-01 MED ORDER — FENTANYL CITRATE (PF) 100 MCG/2ML IJ SOLN: 25.0000 ug | INTRAMUSCULAR | Status: DC | PRN

## 2020-05-01 MED ORDER — AMISULPRIDE (ANTIEMETIC) 5 MG/2ML IV SOLN
10.0000 mg | Freq: Once | INTRAVENOUS | Status: AC
  Administered 2020-05-01: 10 mg via INTRAVENOUS
  Filled 2020-05-01: qty 4

## 2020-05-01 MED ORDER — PHENYLEPHRINE 40 MCG/ML (10ML) SYRINGE FOR IV PUSH (FOR BLOOD PRESSURE SUPPORT): 80.0000 ug | PREFILLED_SYRINGE | INTRAVENOUS | Status: DC | PRN

## 2020-05-01 MED ORDER — OXYTOCIN-SODIUM CHLORIDE 30-0.9 UT/500ML-% IV SOLN: 2.5000 [IU]/h | INTRAVENOUS | Status: DC

## 2020-05-01 MED ORDER — SOD CITRATE-CITRIC ACID 500-334 MG/5ML PO SOLN
30.0000 mL | ORAL | Status: DC | PRN
  Administered 2020-05-01: 30 mL via ORAL
  Filled 2020-05-01: qty 15

## 2020-05-01 MED ORDER — FENTANYL CITRATE (PF) 250 MCG/5ML IJ SOLN
INTRAMUSCULAR | Status: AC
  Filled 2020-05-01: qty 5

## 2020-05-01 MED ORDER — DIPHENHYDRAMINE HCL 25 MG PO CAPS: 25.0000 mg | ORAL_CAPSULE | ORAL | Status: DC | PRN

## 2020-05-01 MED ORDER — SODIUM CHLORIDE 0.9 % IR SOLN
Status: DC | PRN
  Administered 2020-05-01: 1

## 2020-05-01 MED ORDER — KETOROLAC TROMETHAMINE 30 MG/ML IJ SOLN
30.0000 mg | Freq: Four times a day (QID) | INTRAMUSCULAR | Status: AC | PRN
  Filled 2020-05-01: qty 1

## 2020-05-01 MED ORDER — ZOLPIDEM TARTRATE 5 MG PO TABS: 5.0000 mg | ORAL_TABLET | Freq: Every evening | ORAL | Status: DC | PRN

## 2020-05-01 MED ORDER — SCOPOLAMINE 1 MG/3DAYS TD PT72: 1.0000 | MEDICATED_PATCH | Freq: Once | TRANSDERMAL | Status: DC

## 2020-05-01 MED ORDER — IBUPROFEN 800 MG PO TABS
800.0000 mg | ORAL_TABLET | Freq: Three times a day (TID) | ORAL | Status: DC
  Administered 2020-05-02 – 2020-05-03 (×4): 800 mg via ORAL
  Filled 2020-05-01 (×4): qty 1

## 2020-05-01 MED ORDER — MENTHOL 3 MG MT LOZG: 1.0000 | LOZENGE | OROMUCOSAL | Status: DC | PRN

## 2020-05-01 MED ORDER — IBUPROFEN 800 MG PO TABS: 800.0000 mg | ORAL_TABLET | Freq: Three times a day (TID) | ORAL | Status: DC

## 2020-05-01 MED ORDER — DIBUCAINE (PERIANAL) 1 % EX OINT: 1.0000 "application " | TOPICAL_OINTMENT | CUTANEOUS | Status: DC | PRN

## 2020-05-01 MED ORDER — DEXAMETHASONE SODIUM PHOSPHATE 4 MG/ML IJ SOLN
INTRAMUSCULAR | Status: DC | PRN
  Administered 2020-05-01: 4 mg via INTRAVENOUS

## 2020-05-01 MED ORDER — KETOROLAC TROMETHAMINE 30 MG/ML IJ SOLN
INTRAMUSCULAR | Status: AC
  Filled 2020-05-01: qty 1

## 2020-05-01 MED ORDER — ACETAMINOPHEN 325 MG PO TABS
650.0000 mg | ORAL_TABLET | ORAL | Status: DC | PRN
  Administered 2020-05-03 (×2): 650 mg via ORAL
  Filled 2020-05-01 (×2): qty 2

## 2020-05-01 MED ORDER — OXYCODONE-ACETAMINOPHEN 5-325 MG PO TABS: 2.0000 | ORAL_TABLET | ORAL | Status: DC | PRN

## 2020-05-01 MED ORDER — DIPHENHYDRAMINE HCL 25 MG PO CAPS: 25.0000 mg | ORAL_CAPSULE | Freq: Four times a day (QID) | ORAL | Status: DC | PRN

## 2020-05-01 MED ORDER — KETOROLAC TROMETHAMINE 30 MG/ML IJ SOLN
30.0000 mg | Freq: Four times a day (QID) | INTRAMUSCULAR | Status: AC | PRN
  Administered 2020-05-02: 30 mg via INTRAVENOUS

## 2020-05-01 MED ORDER — OXYTOCIN-SODIUM CHLORIDE 30-0.9 UT/500ML-% IV SOLN: 1.0000 m[IU]/min | INTRAVENOUS | Status: DC

## 2020-05-01 MED ORDER — SIMETHICONE 80 MG PO CHEW
80.0000 mg | CHEWABLE_TABLET | ORAL | Status: DC | PRN
  Administered 2020-05-03: 80 mg via ORAL
  Filled 2020-05-01: qty 1

## 2020-05-01 MED ORDER — SIMETHICONE 80 MG PO CHEW
80.0000 mg | CHEWABLE_TABLET | Freq: Three times a day (TID) | ORAL | Status: DC
  Administered 2020-05-02 – 2020-05-03 (×4): 80 mg via ORAL
  Filled 2020-05-01 (×4): qty 1

## 2020-05-01 MED ORDER — FENTANYL CITRATE (PF) 100 MCG/2ML IJ SOLN
INTRAMUSCULAR | Status: DC | PRN
  Administered 2020-05-01: 100 ug via EPIDURAL

## 2020-05-01 MED ORDER — LACTATED RINGERS IV SOLN: INTRAVENOUS | Status: DC | PRN

## 2020-05-01 MED ORDER — SENNOSIDES-DOCUSATE SODIUM 8.6-50 MG PO TABS
2.0000 | ORAL_TABLET | ORAL | Status: DC
  Administered 2020-05-03: 2 via ORAL
  Filled 2020-05-01 (×2): qty 2

## 2020-05-01 MED ORDER — LIDOCAINE HCL (PF) 1 % IJ SOLN: 30.0000 mL | INTRAMUSCULAR | Status: DC | PRN

## 2020-05-01 MED ORDER — ONDANSETRON HCL 4 MG/2ML IJ SOLN: 4.0000 mg | Freq: Four times a day (QID) | INTRAMUSCULAR | Status: DC

## 2020-05-01 MED ORDER — FENTANYL CITRATE (PF) 100 MCG/2ML IJ SOLN
INTRAMUSCULAR | Status: DC | PRN
Start: 2020-05-01 — End: 2020-05-01
  Administered 2020-05-01: 100 ug via INTRAVENOUS
  Administered 2020-05-01: 50 ug via INTRAVENOUS
  Administered 2020-05-01: 100 ug via INTRAVENOUS

## 2020-05-01 MED ORDER — OXYTOCIN-SODIUM CHLORIDE 30-0.9 UT/500ML-% IV SOLN
INTRAVENOUS | Status: DC | PRN
  Administered 2020-05-01: 200 mL via INTRAVENOUS
  Administered 2020-05-01: 100 mL via INTRAVENOUS

## 2020-05-01 MED ORDER — ONDANSETRON HCL 4 MG/2ML IJ SOLN
INTRAMUSCULAR | Status: AC
  Filled 2020-05-01: qty 4

## 2020-05-01 MED ORDER — NALOXONE HCL 4 MG/10ML IJ SOLN
1.0000 ug/kg/h | INTRAVENOUS | Status: DC | PRN
  Filled 2020-05-01: qty 5

## 2020-05-01 MED ORDER — LIDOCAINE-EPINEPHRINE (PF) 2 %-1:200000 IJ SOLN
INTRAMUSCULAR | Status: DC | PRN
  Administered 2020-05-01 (×4): 5 mL via EPIDURAL

## 2020-05-01 MED ORDER — CEFAZOLIN SODIUM-DEXTROSE 2-4 GM/100ML-% IV SOLN
INTRAVENOUS | Status: AC
  Filled 2020-05-01: qty 100

## 2020-05-01 MED ORDER — ACETAMINOPHEN 325 MG PO TABS: 650.0000 mg | ORAL_TABLET | ORAL | Status: DC | PRN

## 2020-05-01 MED ORDER — DIPHENHYDRAMINE HCL 50 MG/ML IJ SOLN: 12.5000 mg | INTRAMUSCULAR | Status: DC | PRN

## 2020-05-01 MED ORDER — ONDANSETRON HCL 4 MG/2ML IJ SOLN
INTRAMUSCULAR | Status: DC | PRN
  Administered 2020-05-01: 4 mg via INTRAVENOUS

## 2020-05-01 MED ORDER — LACTATED RINGERS IV SOLN: 500.0000 mL | INTRAVENOUS | Status: DC | PRN

## 2020-05-01 MED ORDER — ACETAMINOPHEN 500 MG PO TABS
1000.0000 mg | ORAL_TABLET | Freq: Four times a day (QID) | ORAL | Status: AC
  Administered 2020-05-02 (×3): 1000 mg via ORAL
  Filled 2020-05-01 (×3): qty 2

## 2020-05-01 MED ORDER — LACTATED RINGERS IV SOLN: INTRAVENOUS | Status: DC

## 2020-05-01 MED ORDER — NALOXONE HCL 0.4 MG/ML IJ SOLN: 0.4000 mg | INTRAMUSCULAR | Status: DC | PRN

## 2020-05-01 MED ORDER — OXYTOCIN BOLUS FROM INFUSION: 333.0000 mL | Freq: Once | INTRAVENOUS | Status: DC

## 2020-05-01 MED ORDER — LIDOCAINE HCL (PF) 1 % IJ SOLN
INTRAMUSCULAR | Status: DC | PRN
  Administered 2020-05-01 (×2): 4 mL via EPIDURAL

## 2020-05-01 MED ORDER — FENTANYL CITRATE (PF) 100 MCG/2ML IJ SOLN
INTRAMUSCULAR | Status: AC
  Filled 2020-05-01: qty 2

## 2020-05-01 MED ORDER — PHENYLEPHRINE HCL (PRESSORS) 10 MG/ML IV SOLN
INTRAVENOUS | Status: DC | PRN
  Administered 2020-05-01: 80 ug via INTRAVENOUS

## 2020-05-01 MED ORDER — ENOXAPARIN SODIUM 40 MG/0.4ML ~~LOC~~ SOLN
40.0000 mg | SUBCUTANEOUS | Status: DC
  Administered 2020-05-02 – 2020-05-03 (×2): 40 mg via SUBCUTANEOUS
  Filled 2020-05-01 (×2): qty 0.4

## 2020-05-01 MED ORDER — CEFAZOLIN SODIUM-DEXTROSE 2-3 GM-%(50ML) IV SOLR
INTRAVENOUS | Status: DC | PRN
  Administered 2020-05-01: 2 g via INTRAVENOUS

## 2020-05-01 MED ORDER — MORPHINE SULFATE (PF) 0.5 MG/ML IJ SOLN
INTRAMUSCULAR | Status: AC
Start: 2020-05-01 — End: ?
  Filled 2020-05-01: qty 10

## 2020-05-01 MED ORDER — OXYCODONE HCL 5 MG PO TABS: 5.0000 mg | ORAL_TABLET | ORAL | Status: DC | PRN

## 2020-05-01 MED ORDER — ACETAMINOPHEN 10 MG/ML IV SOLN
1000.0000 mg | Freq: Once | INTRAVENOUS | Status: DC | PRN
  Administered 2020-05-01: 1000 mg via INTRAVENOUS

## 2020-05-01 MED ORDER — WITCH HAZEL-GLYCERIN EX PADS: 1.0000 "application " | MEDICATED_PAD | CUTANEOUS | Status: DC | PRN

## 2020-05-01 MED ORDER — MORPHINE SULFATE (PF) 0.5 MG/ML IJ SOLN
INTRAMUSCULAR | Status: DC | PRN
Start: 2020-05-01 — End: 2020-05-01
  Administered 2020-05-01: 3 mg via EPIDURAL

## 2020-05-01 MED ORDER — OXYCODONE-ACETAMINOPHEN 5-325 MG PO TABS: 1.0000 | ORAL_TABLET | ORAL | Status: DC | PRN

## 2020-05-01 MED ORDER — SODIUM CHLORIDE 0.9% FLUSH: 3.0000 mL | INTRAVENOUS | Status: DC | PRN

## 2020-05-01 MED ORDER — ACETAMINOPHEN 10 MG/ML IV SOLN
INTRAVENOUS | Status: AC
  Filled 2020-05-01: qty 100

## 2020-05-01 MED ORDER — ONDANSETRON HCL 4 MG/2ML IJ SOLN
INTRAMUSCULAR | Status: AC
  Administered 2020-05-01: 4 mg via INTRAVENOUS
  Filled 2020-05-01: qty 2

## 2020-05-01 MED ORDER — SCOPOLAMINE 1 MG/3DAYS TD PT72
MEDICATED_PATCH | TRANSDERMAL | Status: AC
  Filled 2020-05-01: qty 1

## 2020-05-01 MED ORDER — OXYTOCIN-SODIUM CHLORIDE 30-0.9 UT/500ML-% IV SOLN
1.0000 m[IU]/min | INTRAVENOUS | Status: DC
  Administered 2020-05-01: 1 m[IU]/min via INTRAVENOUS
  Filled 2020-05-01: qty 500

## 2020-05-01 MED ORDER — ONDANSETRON HCL 4 MG/2ML IJ SOLN: 4.0000 mg | Freq: Three times a day (TID) | INTRAMUSCULAR | Status: DC | PRN

## 2020-05-01 MED ORDER — LACTATED RINGERS IV SOLN
500.0000 mL | Freq: Once | INTRAVENOUS | Status: AC
  Administered 2020-05-01: 500 mL via INTRAVENOUS

## 2020-05-01 MED ORDER — PRENATAL MULTIVITAMIN CH
1.0000 | ORAL_TABLET | Freq: Every day | ORAL | Status: DC
  Administered 2020-05-02 – 2020-05-03 (×2): 1 via ORAL
  Filled 2020-05-01 (×2): qty 1

## 2020-05-01 MED ORDER — SODIUM CHLORIDE (PF) 0.9 % IJ SOLN
INTRAMUSCULAR | Status: DC | PRN
Start: 2020-05-01 — End: 2020-05-01
  Administered 2020-05-01: 12 mL/h via EPIDURAL

## 2020-05-01 MED ORDER — SIMETHICONE 80 MG PO CHEW
80.0000 mg | CHEWABLE_TABLET | ORAL | Status: DC
  Administered 2020-05-03: 80 mg via ORAL
  Filled 2020-05-01 (×2): qty 1

## 2020-05-01 MED ORDER — FENTANYL-BUPIVACAINE-NACL 0.5-0.125-0.9 MG/250ML-% EP SOLN
12.0000 mL/h | EPIDURAL | Status: DC | PRN
  Filled 2020-05-01: qty 250

## 2020-05-01 MED ORDER — TETANUS-DIPHTH-ACELL PERTUSSIS 5-2.5-18.5 LF-MCG/0.5 IM SUSP: 0.5000 mL | Freq: Once | INTRAMUSCULAR | Status: DC

## 2020-05-01 MED ORDER — SCOPOLAMINE 1 MG/3DAYS TD PT72
MEDICATED_PATCH | TRANSDERMAL | Status: DC | PRN
  Administered 2020-05-01: 1 via TRANSDERMAL

## 2020-05-01 MED ORDER — PHENYLEPHRINE 40 MCG/ML (10ML) SYRINGE FOR IV PUSH (FOR BLOOD PRESSURE SUPPORT)
80.0000 ug | PREFILLED_SYRINGE | INTRAVENOUS | Status: DC | PRN
  Filled 2020-05-01: qty 10

## 2020-05-01 MED ORDER — OXYTOCIN-SODIUM CHLORIDE 30-0.9 UT/500ML-% IV SOLN: 2.5000 [IU]/h | INTRAVENOUS | Status: AC

## 2020-05-01 MED ORDER — KETOROLAC TROMETHAMINE 30 MG/ML IJ SOLN
30.0000 mg | Freq: Once | INTRAMUSCULAR | Status: AC | PRN
  Administered 2020-05-01: 30 mg via INTRAVENOUS

## 2020-05-01 SURGICAL SUPPLY — 35 items
BARRIER ADHS 3X4 INTERCEED (GAUZE/BANDAGES/DRESSINGS) IMPLANT
BENZOIN TINCTURE PRP APPL 2/3 (GAUZE/BANDAGES/DRESSINGS) ×3 IMPLANT
CHLORAPREP W/TINT 26ML (MISCELLANEOUS) ×3 IMPLANT
CLAMP CORD UMBIL (MISCELLANEOUS) IMPLANT
CLOSURE STERI STRIP 1/2 X4 (GAUZE/BANDAGES/DRESSINGS) ×3 IMPLANT
CLOTH BEACON ORANGE TIMEOUT ST (SAFETY) ×3 IMPLANT
DRSG OPSITE POSTOP 4X10 (GAUZE/BANDAGES/DRESSINGS) ×3 IMPLANT
ELECT REM PT RETURN 9FT ADLT (ELECTROSURGICAL) ×3
ELECTRODE REM PT RTRN 9FT ADLT (ELECTROSURGICAL) ×1 IMPLANT
EXTRACTOR VACUUM M CUP 4 TUBE (SUCTIONS) IMPLANT
EXTRACTOR VACUUM M CUP 4' TUBE (SUCTIONS)
GLOVE BIO SURGEON STRL SZ 6.5 (GLOVE) ×2 IMPLANT
GLOVE BIO SURGEONS STRL SZ 6.5 (GLOVE) ×1
GLOVE BIOGEL PI IND STRL 7.0 (GLOVE) ×1 IMPLANT
GLOVE BIOGEL PI INDICATOR 7.0 (GLOVE) ×2
GOWN STRL REUS W/TWL LRG LVL3 (GOWN DISPOSABLE) ×6 IMPLANT
KIT ABG SYR 3ML LUER SLIP (SYRINGE) IMPLANT
NEEDLE HYPO 22GX1.5 SAFETY (NEEDLE) IMPLANT
NEEDLE HYPO 25X5/8 SAFETYGLIDE (NEEDLE) ×3 IMPLANT
NS IRRIG 1000ML POUR BTL (IV SOLUTION) ×3 IMPLANT
PACK C SECTION WH (CUSTOM PROCEDURE TRAY) ×3 IMPLANT
PAD OB MATERNITY 4.3X12.25 (PERSONAL CARE ITEMS) ×3 IMPLANT
PENCIL SMOKE EVAC W/HOLSTER (ELECTROSURGICAL) ×3 IMPLANT
SUT CHROMIC 0 CTX 36 (SUTURE) ×9 IMPLANT
SUT PLAIN 0 NONE (SUTURE) IMPLANT
SUT PLAIN 2 0 XLH (SUTURE) IMPLANT
SUT VIC AB 0 CT1 27 (SUTURE) ×6
SUT VIC AB 0 CT1 27XBRD ANBCTR (SUTURE) ×3 IMPLANT
SUT VIC AB 0 CT1 36 (SUTURE) ×3 IMPLANT
SUT VIC AB 4-0 KS 27 (SUTURE) IMPLANT
SYR CONTROL 10ML LL (SYRINGE) IMPLANT
TOWEL OR 17X24 6PK STRL BLUE (TOWEL DISPOSABLE) ×3 IMPLANT
TRAY FOLEY W/BAG SLVR 14FR LF (SET/KITS/TRAYS/PACK) ×3 IMPLANT
VACUUM CUP M-STYLE MYSTIC II (SUCTIONS) IMPLANT
WATER STERILE IRR 1000ML POUR (IV SOLUTION) ×6 IMPLANT

## 2020-05-01 NOTE — Brief Op Note (Signed)
05/01/2020  7:58 PM  PATIENT:  Bonnie Adkins  24 y.o. female  PRE-OPERATIVE DIAGNOSIS:   IUP at term Prothrombin gene mutation Failure to progress   POST-OPERATIVE DIAGNOSIS:  Same  PROCEDURE:  Procedure(s): CESAREAN SECTION (N/A)  SURGEON:  Surgeon(s) and Role:    * Marcelle Overlie, MD - Primary  PHYSICIAN ASSISTANT:   ASSISTANTS: none   ANESTHESIA:  Epidural  EBL:  500  BLOOD ADMINISTERED:none  DRAINS: Urinary Catheter (Foley)   LOCAL MEDICATIONS USED:  NONE  SPECIMEN:  No Specimen  DISPOSITION OF SPECIMEN:  N/A  COUNTS:  YES  TOURNIQUET:  * No tourniquets in log *  DICTATION: .Other Dictation: Dictation Number dictated  PLAN OF CARE: Admit to inpatient   PATIENT DISPOSITION:  PACU - hemodynamically stable.   Delay start of Pharmacological VTE agent (>24hrs) due to surgical blood loss or risk of bleeding: not applicable

## 2020-05-01 NOTE — Consult Note (Signed)
Neonatology Note:   Attendance at C-section:    I was asked by Dr. Grewal to attend this C/S at term (37 1/[redacted] weeks EGA)  for FTP, h/o decels. The mother is a G1, GBS neg with good prenatal care complicated by Rubella NI, Preg induced HTN, PCOS, and Factor 2 deficiency. ROM 11h 12m prior to delivery, fluid clear. Infant vigorous with good spontaneous cry and tone. +60 sec DCC.  Needed minimal bulb suctioning.   Ap 8/9. Lungs clear to ausc in DR. Family updated.  To CN to care of Pediatrician.  Bonnie Vinal C. Armella Stogner, MD  

## 2020-05-01 NOTE — Anesthesia Procedure Notes (Signed)
Epidural Patient location during procedure: OB Start time: 05/01/2020 11:31 AM End time: 05/01/2020 11:39 AM  Staffing Anesthesiologist: Mal Amabile, MD Performed: anesthesiologist   Preanesthetic Checklist Completed: patient identified, IV checked, site marked, risks and benefits discussed, surgical consent, monitors and equipment checked, pre-op evaluation and timeout performed  Epidural Patient position: sitting Prep: DuraPrep and site prepped and draped Patient monitoring: continuous pulse ox and blood pressure Approach: midline Location: L3-L4 Injection technique: LOR air  Needle:  Needle type: Tuohy  Needle gauge: 17 G Needle length: 9 cm and 9 Needle insertion depth: 6 cm Catheter type: closed end flexible Catheter size: 19 Gauge Catheter at skin depth: 11 cm Test dose: negative and Other  Assessment Events: blood not aspirated, injection not painful, no injection resistance, no paresthesia and negative IV test  Additional Notes Patient identified. Risks and benefits discussed including failed block, incomplete  Pain control, post dural puncture headache, nerve damage, paralysis, blood pressure Changes, nausea, vomiting, reactions to medications-both toxic and allergic and post Partum back pain. All questions were answered. Patient expressed understanding and wished to proceed. Sterile technique was used throughout procedure. Epidural site was Dressed with sterile barrier dressing. No paresthesias, signs of intravascular injection Or signs of intrathecal spread were encountered.  Patient was more comfortable after the epidural was dosed. Please see RN's note for documentation of vital signs and FHR which are stable. Reason for block:procedure for pain

## 2020-05-01 NOTE — Transfer of Care (Signed)
Immediate Anesthesia Transfer of Care Note  Patient: Bonnie Adkins  Procedure(s) Performed: CESAREAN SECTION (N/A )  Patient Location: PACU  Anesthesia Type:Epidural  Level of Consciousness: awake, alert  and oriented  Airway & Oxygen Therapy: Patient Spontanous Breathing  Post-op Assessment: Report given to RN and Post -op Vital signs reviewed and stable  Post vital signs: Reviewed and stable  Last Vitals:  Vitals Value Taken Time  BP 112/55 05/01/20 2008  Temp    Pulse 102 05/01/20 2015  Resp 21 05/01/20 2014  SpO2 97 % 05/01/20 2015  Vitals shown include unvalidated device data.  Last Pain:  Vitals:   05/01/20 1850  TempSrc: Oral  PainSc:       Patients Stated Pain Goal: 0 (05/01/20 1542)  Complications: No complications documented.

## 2020-05-01 NOTE — Progress Notes (Signed)
Patient doing well Last lovenox one week ago  FHR Category 1 Cervix is 90% 3 cm -2  Vertex arom Clear fluid

## 2020-05-01 NOTE — Anesthesia Preprocedure Evaluation (Signed)
Anesthesia Evaluation  Patient identified by MRN, date of birth, ID band Patient awake    Reviewed: Allergy & Precautions, Patient's Chart, lab work & pertinent test results  Airway Mallampati: II  TM Distance: >3 FB Neck ROM: Full    Dental no notable dental hx. (+) Teeth Intact   Pulmonary pneumonia, resolved,    Pulmonary exam normal breath sounds clear to auscultation       Cardiovascular hypertension, Normal cardiovascular exam Rhythm:Regular Rate:Normal  Hx/o prolonged QTc   Neuro/Psych PSYCHIATRIC DISORDERS Depression TIA   GI/Hepatic Neg liver ROS, GERD  Medicated,  Endo/Other  Obesity PCOS  Renal/GU negative Renal ROS  negative genitourinary   Musculoskeletal negative musculoskeletal ROS (+)   Abdominal (+) + obese,   Peds  Hematology  (+) anemia , Factor 2 gene mutation Lovenox therapy last dose greater that 1 week Has not started unfractionated heparin   Anesthesia Other Findings   Reproductive/Obstetrics                             Anesthesia Physical Anesthesia Plan  ASA: III  Anesthesia Plan: Epidural   Post-op Pain Management:    Induction:   PONV Risk Score and Plan:   Airway Management Planned: Natural Airway  Additional Equipment:   Intra-op Plan:   Post-operative Plan:   Informed Consent: I have reviewed the patients History and Physical, chart, labs and discussed the procedure including the risks, benefits and alternatives for the proposed anesthesia with the patient or authorized representative who has indicated his/her understanding and acceptance.       Plan Discussed with: Anesthesiologist  Anesthesia Plan Comments:         Anesthesia Quick Evaluation

## 2020-05-01 NOTE — Progress Notes (Signed)
Throughout day there have been two episodes of prolonged decelerations Seems to occur when patient is on her side  Have had to discontinue Pitocin  Cervix is 8 cm  Fetus is transverse position and with significant caput and cervix is puffy posteriorly  Station at -1 Discussion with patient options: - restart pitocin but if deceleration were to occur again I would recommend C Section - C Section now - given fetal position and unable to put patient on her side and no change in station since this morning  Patient and her spouse had a discussion - she is exhausted and nauseated  Wants to proceed with C Section if Vaginal delivery not guaranteed  OR notified  Risks reviewed Consent signed

## 2020-05-02 ENCOUNTER — Encounter (HOSPITAL_COMMUNITY): Payer: Self-pay | Admitting: Obstetrics and Gynecology

## 2020-05-02 LAB — CBC
HCT: 26.8 % — ABNORMAL LOW (ref 36.0–46.0)
Hemoglobin: 8.8 g/dL — ABNORMAL LOW (ref 12.0–15.0)
MCH: 28.3 pg (ref 26.0–34.0)
MCHC: 32.8 g/dL (ref 30.0–36.0)
MCV: 86.2 fL (ref 80.0–100.0)
Platelets: 182 10*3/uL (ref 150–400)
RBC: 3.11 MIL/uL — ABNORMAL LOW (ref 3.87–5.11)
RDW: 14.4 % (ref 11.5–15.5)
WBC: 16.3 10*3/uL — ABNORMAL HIGH (ref 4.0–10.5)
nRBC: 0 % (ref 0.0–0.2)

## 2020-05-02 LAB — RUBELLA SCREEN: Rubella: <0.9 index — ABNORMAL LOW (ref >0.99)

## 2020-05-02 MED ORDER — TRAMADOL HCL 50 MG PO TABS
50.0000 mg | ORAL_TABLET | Freq: Four times a day (QID) | ORAL | Status: DC | PRN
  Administered 2020-05-02 – 2020-05-03 (×4): 50 mg via ORAL
  Filled 2020-05-02 (×4): qty 1

## 2020-05-02 MED ORDER — ONDANSETRON HCL 4 MG PO TABS
4.0000 mg | ORAL_TABLET | Freq: Three times a day (TID) | ORAL | Status: DC | PRN
  Administered 2020-05-02 – 2020-05-03 (×4): 4 mg via ORAL
  Filled 2020-05-02 (×4): qty 1

## 2020-05-02 MED ORDER — RHO D IMMUNE GLOBULIN 1500 UNIT/2ML IJ SOSY
300.0000 ug | PREFILLED_SYRINGE | Freq: Once | INTRAMUSCULAR | Status: AC
  Administered 2020-05-02: 300 ug via INTRAVENOUS
  Filled 2020-05-02: qty 2

## 2020-05-02 NOTE — Progress Notes (Signed)
Subjective: Postpartum Day 1: Cesarean Delivery Patient reports tolerating PO and + flatus.    Objective: Vital signs in last 24 hours: Temp:  [97.7 F (36.5 C)-98.8 F (37.1 C)] 98.6 F (37 C) (10/08 0339) Pulse Rate:  [69-107] 76 (10/08 0339) Resp:  [16-26] 17 (10/08 0339) BP: (103-156)/(55-97) 135/84 (10/08 0339) SpO2:  [95 %-100 %] 100 % (10/08 0339) Weight:  [82.3 kg] 82.3 kg (10/07 0807)  Physical Exam:  General: alert, cooperative and no distress Lochia: appropriate Uterine Fundus: firm Incision: healing well DVT Evaluation: No evidence of DVT seen on physical exam. Foley still in place Recent Labs    05/01/20 2017 05/02/20 0536  HGB 10.6* 8.8*  HCT 33.7* 26.8*    Assessment/Plan: Status post Cesarean section. Doing well postoperatively.  Continue current care. Prothrombin gene mutation>Lovenox 40mg  qd start 1200 today D/W circumcision of newborn boy. Risks reviewed. She states she understands and agrees. II 05/02/2020, 7:07 AM

## 2020-05-02 NOTE — Progress Notes (Signed)
MOB was referred for history of depression/anxiety. °* Referral screened out by Clinical Social Worker because none of the following criteria appear to apply: °~ History of anxiety/depression during this pregnancy, or of post-partum depression following prior delivery. °~ Diagnosis of anxiety and/or depression within last 3 years.Per further chart review, MOB was diagnosed with anxiety in 2015.  °OR °* MOB's symptoms currently being treated with medication and/or therapy.Per further chart review, it is noted in MOB's PNC records that she takes Sertraline  For anxiety.  ° °. °Please contact the Clinical Social Worker if needs arise, by MOB request, or if MOB scores greater than 9/yes to question 10 on Edinburgh Postpartum Depression Screen. ° ° ° °Lorri Fukuhara S. Tiago Humphrey, MSW, LCSW °[facility] °[phone]  ° °

## 2020-05-02 NOTE — Anesthesia Postprocedure Evaluation (Signed)
Anesthesia Post Note  Patient: Deette Shidler  Procedure(s) Performed: CESAREAN SECTION (N/A )     Patient location during evaluation: PACU Anesthesia Type: Epidural Level of consciousness: oriented and awake and alert Pain management: pain level controlled Vital Signs Assessment: post-procedure vital signs reviewed and stable Respiratory status: spontaneous breathing, respiratory function stable and patient connected to nasal cannula oxygen Cardiovascular status: blood pressure returned to baseline and stable Postop Assessment: no headache, no backache and no apparent nausea or vomiting Anesthetic complications: no   No complications documented.  Last Vitals:  Vitals:   05/02/20 0100 05/02/20 0339  BP: 131/87 135/84  Pulse: 71 76  Resp: 18 17  Temp: 36.9 C 37 C  SpO2: 100% 100%    Last Pain:  Vitals:   05/02/20 0339  TempSrc: Oral  PainSc: 4    Pain Goal: Patients Stated Pain Goal: 2 (05/02/20 0339)                 Romie Jumper L Arbie Reisz

## 2020-05-03 LAB — RH IG WORKUP (INCLUDES ABO/RH)
ABO/RH(D): O NEG
Fetal Screen: NEGATIVE
Gestational Age(Wks): 37.1
Unit division: 0

## 2020-05-03 MED ORDER — MEASLES, MUMPS & RUBELLA VAC IJ SOLR
0.5000 mL | Freq: Once | INTRAMUSCULAR | Status: AC
  Administered 2020-05-03: 0.5 mL via SUBCUTANEOUS
  Filled 2020-05-03: qty 0.5

## 2020-05-03 MED ORDER — IBUPROFEN 800 MG PO TABS: 800.0000 mg | ORAL_TABLET | Freq: Three times a day (TID) | ORAL | 0 refills | Status: AC | PRN

## 2020-05-03 MED ORDER — TRAMADOL HCL 50 MG PO TABS
50.0000 mg | ORAL_TABLET | Freq: Four times a day (QID) | ORAL | 0 refills | Status: DC | PRN
Start: 2020-05-03 — End: 2021-11-28

## 2020-05-03 MED ORDER — ONDANSETRON HCL 4 MG PO TABS: 4.0000 mg | ORAL_TABLET | Freq: Three times a day (TID) | ORAL | 0 refills | Status: DC | PRN

## 2020-05-03 NOTE — Discharge Summary (Signed)
Postpartum Discharge Summary  Date of Service updated 05/03/20     Patient Name: Bonnie Adkins DOB: [birth year] MRN: [id number]  Date of admission: 05/01/2020 Delivery date:05/01/2020  Delivering provider: Dian Queen  Date of discharge: 05/03/2020  Admitting diagnosis: Pregnancy [Z34.90] S/P cesarean section [Z98.891] Intrauterine pregnancy: [redacted]w[redacted]d    Secondary diagnosis:  Active Problems:   Pregnancy   S/P cesarean section  Additional problems: prothrombin gene mutation    Discharge diagnosis: Term Pregnancy Delivered                                              Post partum procedures: Augmentation: AROM and Pitocin Complications: None  Hospital course: Induction of Labor With Cesarean Section   24y.o. yo G1P1001 at 324w1das admitted to the hospital 05/01/2020 for induction of labor. Patient had a labor course significant for failure to progress. The patient went for cesarean section due to Arrest of Dilation. Delivery details are as follows: Membrane Rupture Time/Date: 8:13 AM ,05/01/2020   Delivery Method:C-Section, Vacuum Assisted  Details of operation can be found in separate operative Note.  Patient had an uncomplicated postpartum course. She is ambulating, tolerating a regular diet, passing flatus, and urinating well.  Patient is discharged home in stable condition on 05/03/20.      Newborn Data: Birth date:05/01/2020  Birth time:7:25 PM  Gender:Female  Living status:Living  Apgars:8 ,9  Weight:3105 g                                 Magnesium Sulfate received: No BMZ received: No Rhophylac:Yes MMR:No T-DaP:Given prenatally Flu: No Transfusion:No  Physical exam  Vitals:   05/02/20 1402 05/02/20 2057 05/02/20 2219 05/03/20 0516  BP: 124/81 (!) 135/97 122/88 132/88  Pulse: 80 75  60  Resp: '17 18  16  ' Temp: 98.3 F (36.8 C) 98.1 F (36.7 C)  (!) 97.3 F (36.3 C)  TempSrc: Oral Oral  Oral  SpO2:  100%  100%  Weight:      Height:       General: alert,  cooperative and no distress Lochia: appropriate Uterine Fundus: firm Incision: Healing well with no significant drainage DVT Evaluation: No evidence of DVT seen on physical exam. Labs: Lab Results  Component Value Date   WBC 16.3 (H) 05/02/2020   HGB 8.8 (L) 05/02/2020   HCT 26.8 (L) 05/02/2020   MCV 86.2 05/02/2020   PLT 182 05/02/2020   CMP Latest Ref Rng & Units 08/26/2018  Glucose 70 - 99 mg/dL 110(H)  BUN 6 - 20 mg/dL 8  Creatinine 0.44 - 1.00 mg/dL 0.68  Sodium 135 - 145 mmol/L 139  Potassium 3.5 - 5.1 mmol/L 3.8  Chloride 98 - 111 mmol/L 104  CO2 22 - 32 mmol/L 24  Calcium 8.9 - 10.3 mg/dL 9.9  Total Protein 6.5 - 8.1 g/dL -  Total Bilirubin 0.3 - 1.2 mg/dL -  Alkaline Phos 38 - 126 U/L -  AST 15 - 41 U/L -  ALT 0 - 44 U/L -   Edinburgh Score: Edinburgh Postnatal Depression Scale Screening Tool 05/03/2020  I have been able to laugh and see the funny side of things. 0  I have looked forward with enjoyment to things. 0  I have blamed myself unnecessarily when things  went wrong. 0  I have been anxious or worried for no good reason. 0  I have felt scared or panicky for no good reason. 0  Things have been getting on top of me. 0  I have been so unhappy that I have had difficulty sleeping. 0  I have felt sad or miserable. 0  I have been so unhappy that I have been crying. 0  The thought of harming myself has occurred to me. 0  Edinburgh Postnatal Depression Scale Total 0      After visit meds:  Allergies as of 05/03/2020      Reactions   Pseudoephedrine Hcl Palpitations   Azithromycin Other (See Comments)   eregulatarit on EKG      Medication List    STOP taking these medications   aspirin 81 MG EC tablet   cetirizine 10 MG tablet Commonly known as: ZYRTEC   doxylamine (Sleep) 25 MG tablet Commonly known as: UNISOM   prenatal multivitamin Tabs tablet   PROBIOTIC PO   rosuvastatin 20 MG tablet Commonly known as: CRESTOR   vitamin B-6 25 MG  tablet Commonly known as: pyridOXINE     TAKE these medications   acetaminophen 500 MG tablet Commonly known as: TYLENOL Take 500 mg by mouth every 6 (six) hours as needed for mild pain. For pain   enoxaparin 40 MG/0.4ML injection Commonly known as: LOVENOX Inject 40 mg into the skin daily.   ibuprofen 800 MG tablet Commonly known as: ADVIL Take 1 tablet (800 mg total) by mouth every 8 (eight) hours as needed.   metFORMIN 500 MG 24 hr tablet Commonly known as: GLUCOPHAGE-XR Take 1 tablet (500 mg total) by mouth 2 (two) times daily. This was held in the hospital.  Restart taking it from 04/13/2018. What changed: when to take this   ondansetron 4 MG tablet Commonly known as: ZOFRAN Take 1 tablet (4 mg total) by mouth every 8 (eight) hours as needed for nausea or vomiting.   sertraline 50 MG tablet Commonly known as: ZOLOFT Take 50 mg by mouth at bedtime.   traMADol 50 MG tablet Commonly known as: ULTRAM Take 1 tablet (50 mg total) by mouth every 6 (six) hours as needed for severe pain.        Discharge home in stable condition Infant Feeding: Bottle Infant Disposition:home with mother Discharge instruction: per After Visit Summary and Postpartum booklet. Activity: Advance as tolerated. Pelvic rest for 6 weeks.  Diet: routine diet Anticipated Birth Control: Unsure Postpartum Appointment:6 weeks Additional Postpartum F/U:  Future Appointments:No future appointments. Follow up Visit:      05/03/2020 Allena Katz, MD

## 2020-05-03 NOTE — Progress Notes (Signed)
Subjective: Postpartum Day 2: Cesarean Delivery Patient reports incisional pain, tolerating PO, + flatus and no problems voiding.    Objective: Vital signs in last 24 hours: Temp:  [97.3 F (36.3 C)-98.3 F (36.8 C)] 97.3 F (36.3 C) (10/09 0516) Pulse Rate:  [60-82] 60 (10/09 0516) Resp:  [16-18] 16 (10/09 0516) BP: (121-135)/(80-97) 132/88 (10/09 0516) SpO2:  [98 %-100 %] 100 % (10/09 0516)  Physical Exam:  General: alert, cooperative and no distress Lochia: appropriate Uterine Fundus: firm Incision: healing well DVT Evaluation: No evidence of DVT seen on physical exam.  Recent Labs    05/01/20 2017 05/02/20 0536  HGB 10.6* 8.8*  HCT 33.7* 26.8*    Assessment/Plan: Status post Cesarean section. Doing well postoperatively.  Discharge home with standard precautions and return to clinic in 4-6 weeks. Wants to go home. Doing well with tramadol (N/V with other narcotics), takes zofran prn with tramadol. Baby had some grunting yesterday, OK today. She wants to proceed with circumcision Bonnie Adkins 05/03/2020, 8:13 AM

## 2020-05-06 NOTE — Op Note (Signed)
Bonnie Adkins, GRANT MEDICAL RECORD EG:[id number] ACCOUNT [id number] DATE OF BIRTH:1995/12/19 FACILITY: [facility] LOCATION: [facility] PHYSICIAN:Nelia Rogoff Rosita Fire, MD  OPERATIVE REPORT  DATE OF PROCEDURE:  05/01/2020  PREOPERATIVE DIAGNOSES:  Intrauterine pregnancy at 37 weeks and 1 day, failure to progress and prothrombin gene mutation.  POSTOPERATIVE DIAGNOSES:  Intrauterine pregnancy at 37 weeks and 1 day, failure to progress and prothrombin gene mutation.  PROCEDURE:  Primary low transverse cesarean section.  SURGEON:  Marcelle Overlie, MD  ESTIMATED BLOOD LOSS:  500 mL  COMPLICATIONS:  None.  DRAINS:  Foley catheter.  ANESTHESIA:  Epidural.  DESCRIPTION OF PROCEDURE:  The patient was taken to the operating room.  She was then prepped and draped after her epidural was found to be adequate.  A low transverse incision was made at the skin after a timeout was performed.  This was carried down to  the fascia.  Fascia was scored in the midline and extended laterally.  The rectus muscles were separated in the midline.  The peritoneum was entered bluntly.  Peritoneal incision was then stretched.  The lower uterine segment was identified and then the  bladder flap was created sharply and then digitally.  A low transverse incision was made in the uterus.  Amniotic fluid was clear.  The baby was in occiput-transverse position and was delivered easily with a vacuum extractor.  The baby was a female  infant, Apgars 9 at one minute and 9 at five minutes.  The cord was clamped and cut.  The baby was handed to the waiting neonatal team after the cord was clamped and cut.  The placenta was manually removed, noted to be normal, intact with the 3-vessel  cord.  The uterus was exteriorized of all clots and debris.  The uterine incision was closed in 2 layers in a running locked stitch.  The uterus was returned to the abdomen.  Irrigation was performed.  Hemostasis was noted.  The peritoneum was closed  using 0  Vicryl.  The fascia was closed using 0 Vicryl starting in each corner and meeting in the midline.  After irrigation of the subcutaneous layer, the skin was closed with 3-0 Vicryl on a Keith needle.  Benzoin, Steri-Strips, and a honeycomb dressing  were applied.  All sponge, lap and instrument counts were correct x2.  The patient went to recovery room in stable condition.  IN/NUANCE  D:05/06/2020 T:05/06/2020 JOB:[id number]

## 2020-08-21 IMAGING — CR DG CHEST 2V
2 series · 2 of 2 positions shown · non-contrast
Comparison: 08/25/2018

CLINICAL DATA: Cough

EXAM:
CHEST - 2 VIEW

[w chest pa]
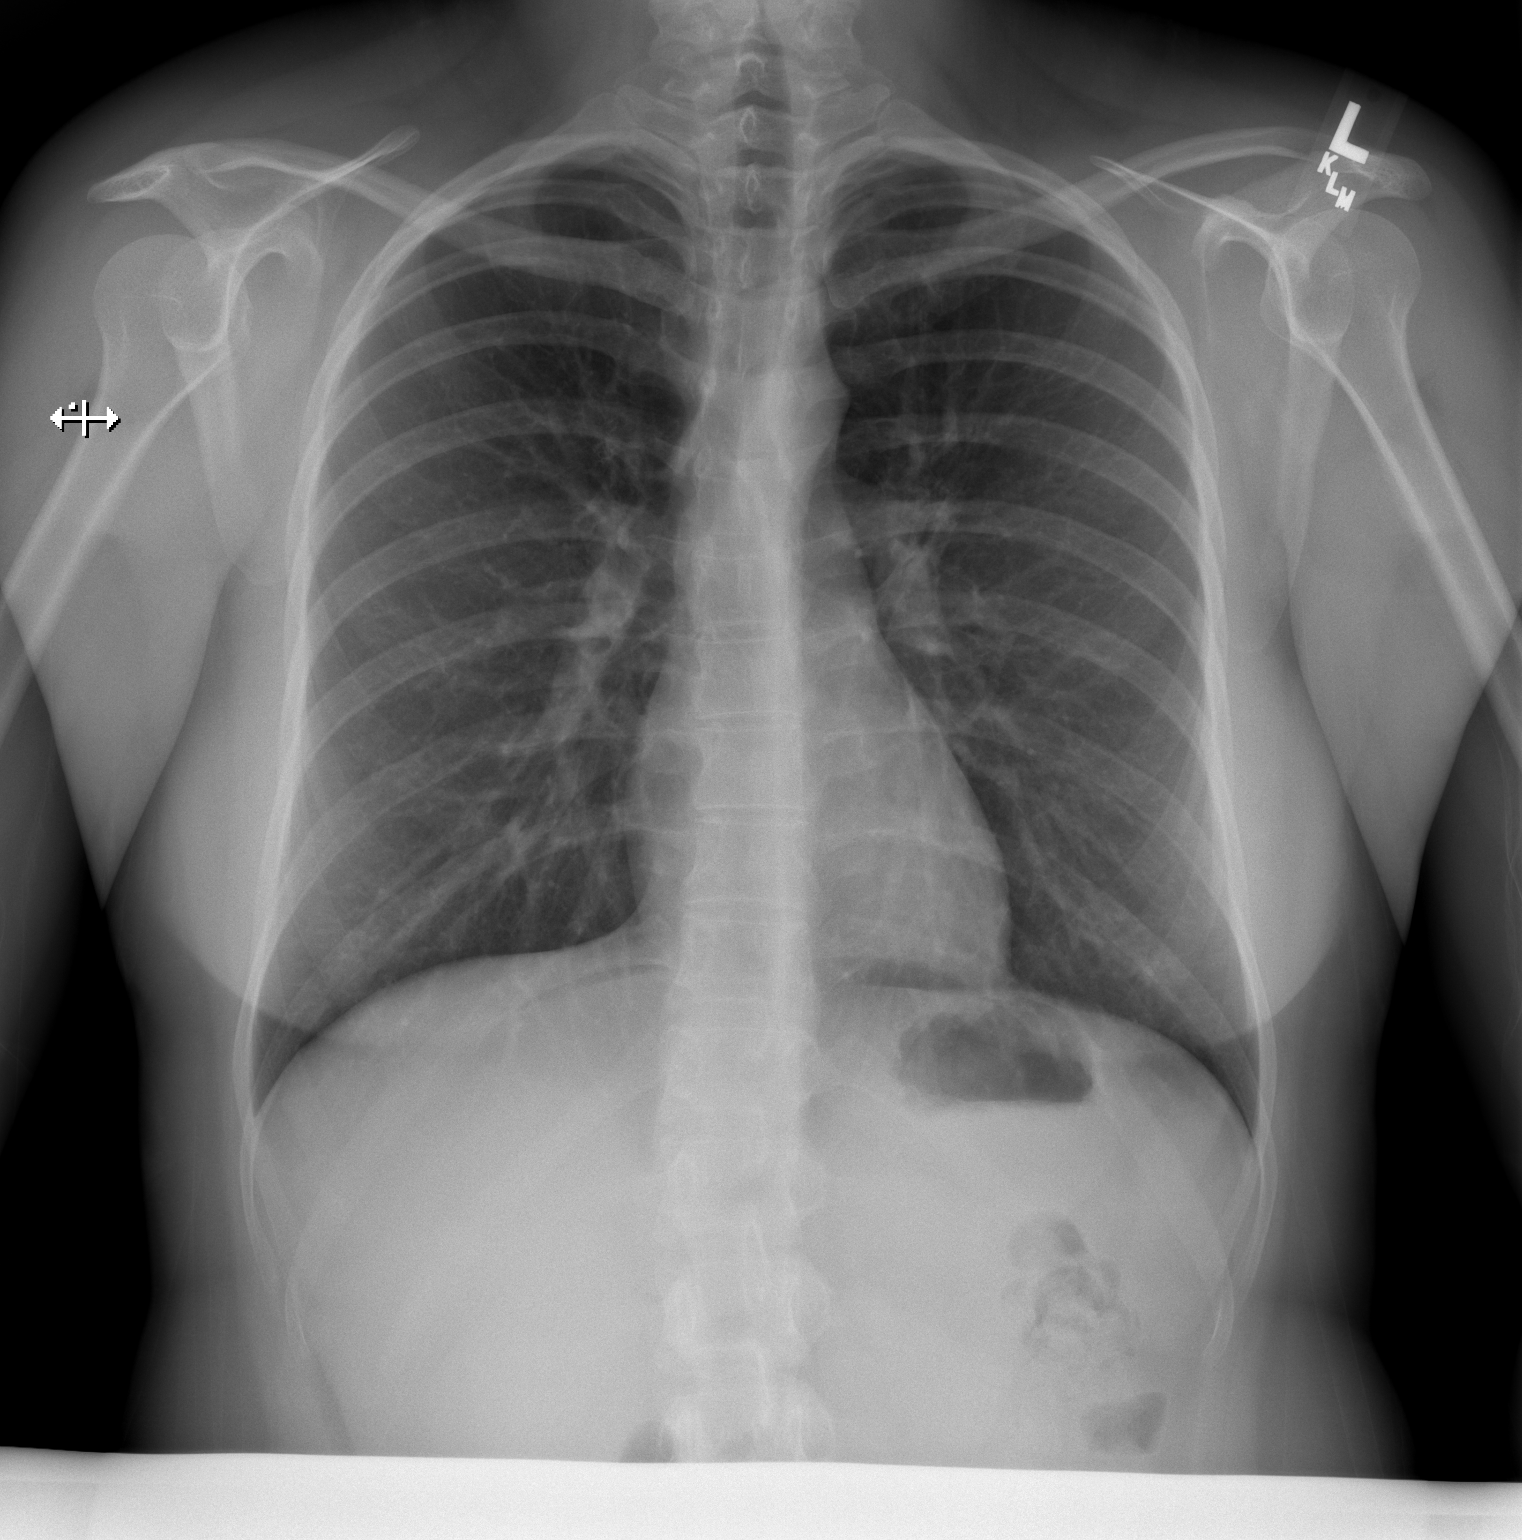

[w chest lat]
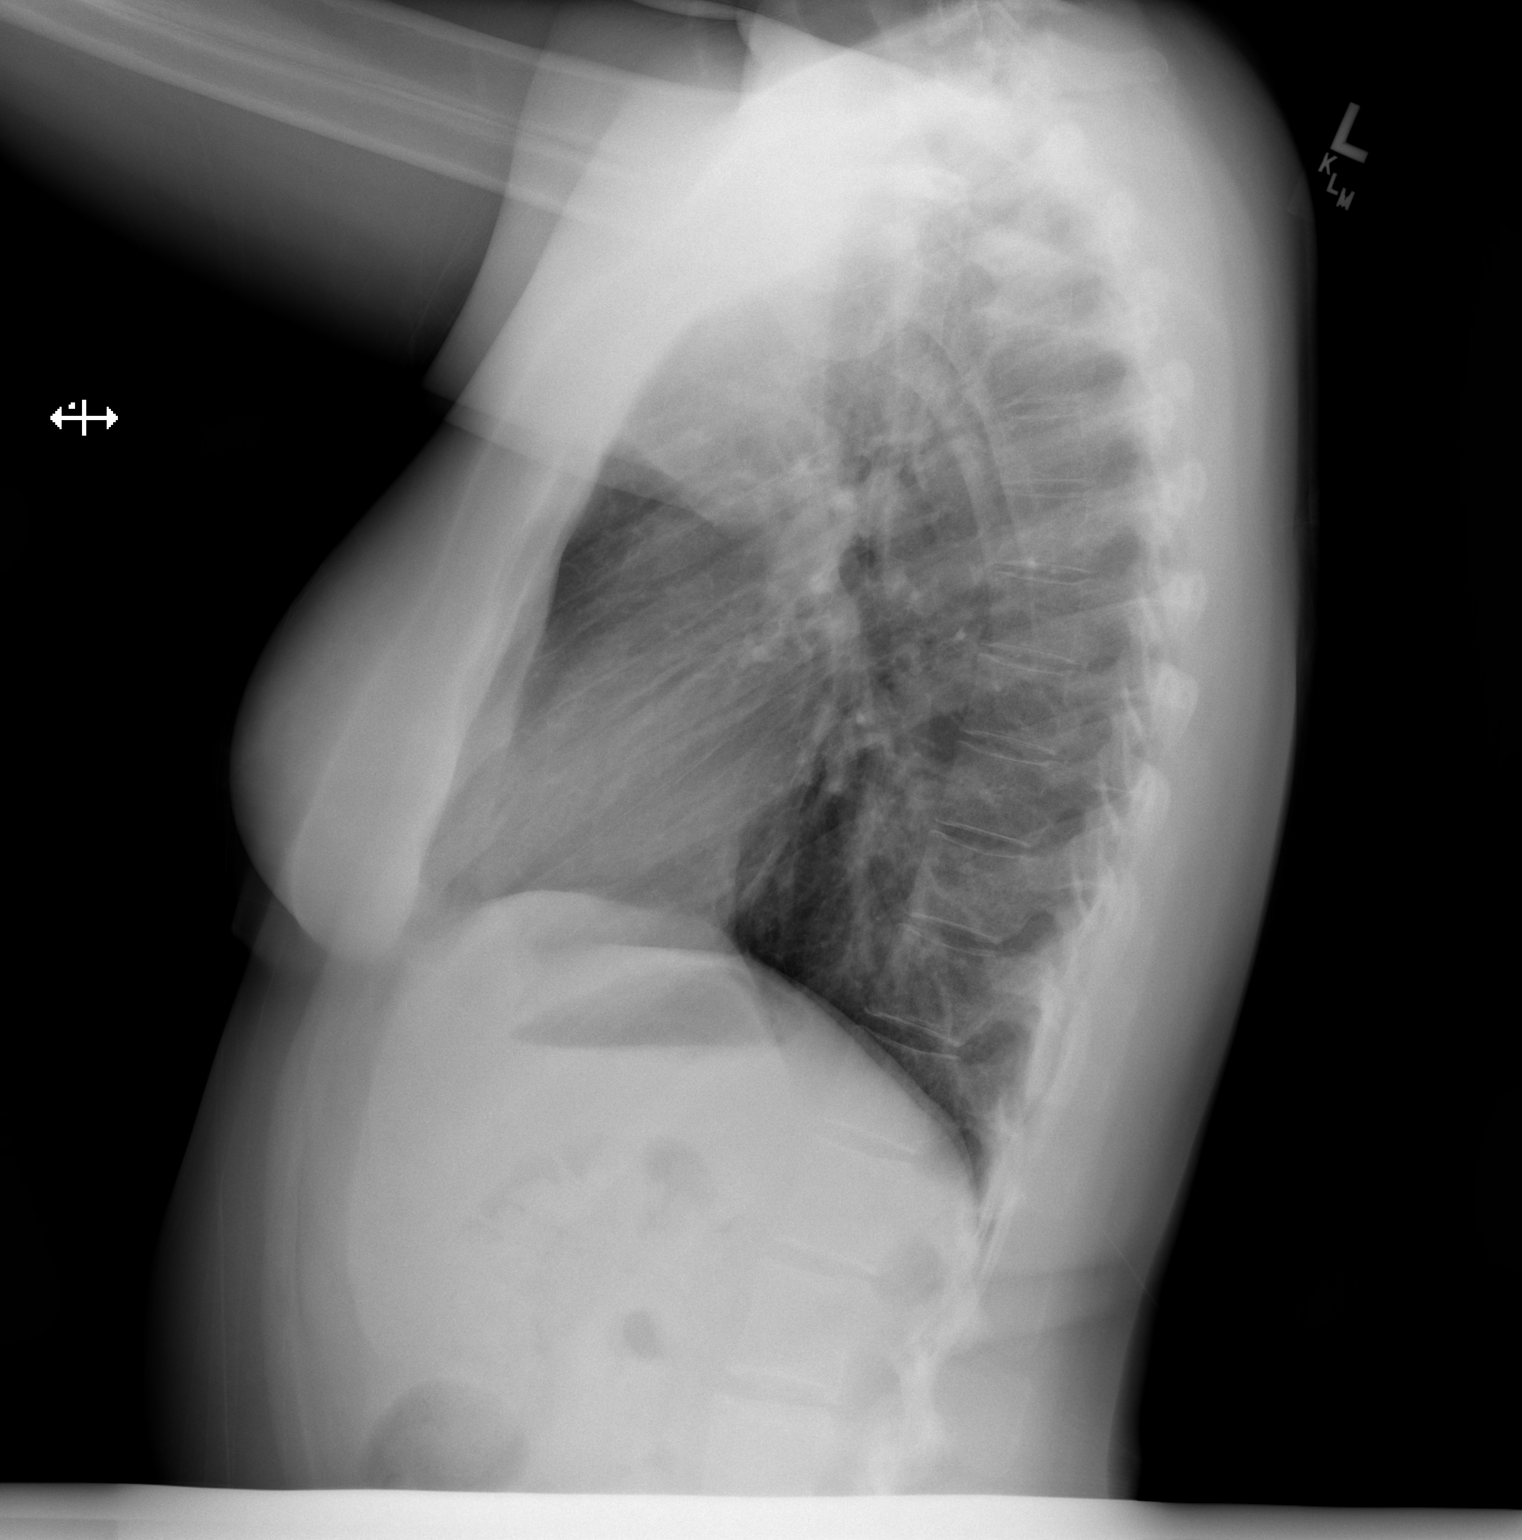

[2 of 2 positions shown; findings below may reference images not displayed]

FINDINGS: The heart size and mediastinal contours are within normal limits.
Both lungs are clear. The visualized skeletal structures are
unremarkable.
IMPRESSION: No active cardiopulmonary disease.

## 2021-11-28 ENCOUNTER — Ambulatory Visit
Admission: EM | Admit: 2021-11-28 | Discharge: 2021-11-28 | Disposition: A | Discharge status: Home / Self Care | Payer: 59 | Attending: Family Medicine | Admitting: Family Medicine

## 2021-11-28 DIAGNOSIS — M7989 Other specified soft tissue disorders: Secondary | ICD-10-CM | POA: Diagnosis not present

## 2021-11-28 DIAGNOSIS — R21 Rash and other nonspecific skin eruption: Secondary | ICD-10-CM | POA: Diagnosis not present

## 2021-11-28 LAB — POCT URINALYSIS DIP (MANUAL ENTRY)
Bilirubin, UA: NEGATIVE
Blood, UA: NEGATIVE
Glucose, UA: NEGATIVE mg/dL
Ketones, POC UA: NEGATIVE mg/dL
Leukocytes, UA: NEGATIVE
Nitrite, UA: NEGATIVE
Protein Ur, POC: NEGATIVE mg/dL
Spec Grav, UA: 1.015 (ref 1.010–1.025)
Urobilinogen, UA: 0.2 E.U./dL
pH, UA: 6.5 (ref 5.0–8.0)

## 2021-11-28 MED ORDER — PREDNISONE 20 MG PO TABS: 40.0000 mg | ORAL_TABLET | Freq: Every day | ORAL | 0 refills | Status: AC

## 2021-11-28 NOTE — ED Triage Notes (Signed)
Pt states that yesterday her middle finger on her left hand was hurting ? ?Pt states that throughout the day both hands were swollen and hurting and feeling really tight ? ?Pt states that last night she noticed some rash on both forearms, on both feet, inner thigh, stomach and forehead ? ?Pt states she has not changed any Deoderant, detergent, meds, or shampoos ? ?Pt states she tried some Benadryl last night without any relief ?

## 2021-11-28 NOTE — Discharge Instructions (Addendum)
Continue anti-histamine. ? ?You have had labs (blood work) drawn today. We will call you with any significant abnormalities or if there is need to begin or change treatment or pursue further follow up. ? ?You Giovanetti also review your test results online through MyChart. If you do not have a MyChart account, instructions to sign up should be on your discharge paperwork. ? ?

## 2021-11-30 NOTE — ED Provider Notes (Signed)
?[facility] ? ? ?[id number] ?11/28/21 Arrival Time: [phone] ? ?ASSESSMENT & PLAN: ? ?1. Rash and nonspecific skin eruption   ?2. Bilateral hand swelling   ?3. Bilateral swelling of feet   ? ?Unable to draw blood. She prefers to wait. Unclear etiology of hand swelling. No proteinuria. ?Ques allergic. ? ?Begin trial of: ?Meds ordered this encounter  ?Medications  ? predniSONE (DELTASONE) 20 MG tablet  ?  Sig: Take 2 tablets (40 mg total) by mouth daily.  ?  Dispense:  10 tablet  ?  Refill:  0  ? ?Labs Reviewed  ?POCT URINALYSIS DIP (MANUAL ENTRY)  ? ? ? Follow-up Information   ? ? Eartha Inch, MD.   ?Specialty: Family Medicine ?Why: If worsening or failing to improve as anticipated. ?Contact information: ?[address] ?[place] [address] ?[phone] ? ? ?  ?  ? ?  ?  ? ?  ? ? ? ?Will follow up with PCP or here if worsening or failing to improve as anticipated. ?Reviewed expectations re: course of current medical issues. Questions answered. ?Outlined signs and symptoms indicating need for more acute intervention. ?Patient verbalized understanding. ?After Visit Summary given. ? ? ?SUBJECTIVE: ? ?Bonnie Adkins is a 26 y.o. female who reports bilateral hand and feet swelling; more hands. Mild itching. No pain. Afebrile. No recent illnesses. Normal PO intake without n/v/d. Slight erythema over hands and feet noted today. No new exposures. No abd/back pain. Normal bowel/bladder habits. ?No tx PTA. ? ?OBJECTIVE: ?Vitals:  ? 11/28/21 1054  ?BP: 127/85  ?Pulse: 77  ?Resp: 18  ?Temp: 97.7 ?F (36.5 ?C)  ?TempSrc: Oral  ?SpO2: 97%  ?  ?General appearance: alert; no distress ?HEENT: [place]; AT ?Neck: supple with FROM ?Lungs: clear to auscultation bilaterally ?Extremities: no edema; moves all extremities normally; mild swelling of hands and feet ?Skin: warm and dry; very slight erythema over hands; splotchy; no open wounds ?Psychological: alert and cooperative; normal mood and affect ? ?Allergies  ?Allergen Reactions   ? Pseudoephedrine Hcl Palpitations  ? Azithromycin Other (See Comments)  ?  eregulatarit on EKG  ? ? ?Past Medical History:  ?Diagnosis Date  ? Abdominal pain, recurrent   ? Diarrhea   ? Factor II deficiency (HCC)   ? High cholesterol   ? Hypoglycemia   ? IBS (irritable bowel syndrome)   ? PCOS (polycystic ovarian syndrome)   ? Pregnancy induced hypertension   ? ?Social History  ? ?Socioeconomic History  ? Marital status: Married  ?  Spouse name: Elige Radon  ? Number of children: 0  ? Years of education: 51  ? Highest education level: Associate degree: academic program  ?Occupational History  ? Occupation: Radiological tech  ?Tobacco Use  ? Smoking status: Never  ? Smokeless tobacco: Never  ?Vaping Use  ? Vaping Use: Never used  ?Substance and Sexual Activity  ? Alcohol use: No  ? Drug use: No  ? Sexual activity: Yes  ?  Birth control/protection: Injection  ?Other Topics Concern  ? Not on file  ?Social History Narrative  ? Not on file  ? ?Social Determinants of Health  ? ?Financial Resource Strain: Not on file  ?Food Insecurity: Not on file  ?Transportation Needs: Not on file  ?Physical Activity: Not on file  ?Stress: Not on file  ?Social Connections: Not on file  ?Intimate Partner Violence: Not on file  ? ?Family History  ?Problem Relation Age of Onset  ? Venous thrombosis Mother   ? ?Past  Surgical History:  ?Procedure Laterality Date  ? CESAREAN SECTION N/A 05/01/2020  ? Procedure: CESAREAN SECTION;  Surgeon: Marcelle Overlie, MD;  Location: [facility];  Service: Obstetrics;  Laterality: N/A;  ? MANDIBLE SURGERY    ? TEE WITHOUT CARDIOVERSION N/A 12/07/2016  ? Procedure: TRANSESOPHAGEAL ECHOCARDIOGRAM (TEE);  Surgeon: Yates Decamp, MD;  Location: [facility] ENDOSCOPY;  Service: Cardiovascular;  Laterality: N/A;  ? TYMPANOSTOMY TUBE PLACEMENT    ? ? ?  ?Mardella Layman, MD ?11/30/21 [phone] ? ?

## 2023-04-22 ENCOUNTER — Other Ambulatory Visit (HOSPITAL_COMMUNITY): Payer: Self-pay

## 2023-04-22 MED ORDER — ADDERALL XR 20 MG PO CP24
20.0000 mg | ORAL_CAPSULE | Freq: Every morning | ORAL | 0 refills | Status: DC
  Filled 2023-04-22 – 2023-04-25 (×2): qty 30, 30d supply, fill #0

## 2023-04-25 ENCOUNTER — Other Ambulatory Visit (HOSPITAL_COMMUNITY): Payer: Self-pay

## 2023-04-25 MED ORDER — AMPHETAMINE-DEXTROAMPHET ER 20 MG PO CP24
20.0000 mg | ORAL_CAPSULE | Freq: Every morning | ORAL | 0 refills | Status: DC
  Filled 2023-04-25: qty 30, 30d supply, fill #0

## 2023-06-10 ENCOUNTER — Other Ambulatory Visit (HOSPITAL_COMMUNITY): Payer: Self-pay

## 2023-06-10 MED ORDER — AMPHETAMINE-DEXTROAMPHET ER 20 MG PO CP24
20.0000 mg | ORAL_CAPSULE | Freq: Every morning | ORAL | 0 refills | Status: AC
  Filled 2023-06-10: qty 30, 30d supply, fill #0
